# Patient Record
Sex: Male | Born: 1985 | Race: Black or African American | Hispanic: No | Marital: Single | Smoking: Current every day smoker
Health system: Southern US, Community
[De-identification: ages and names within clinical notes are randomized; demographics above are authoritative.]

## PROBLEM LIST (undated history)

## (undated) DIAGNOSIS — G904 Autonomic dysreflexia: Secondary | ICD-10-CM

## (undated) DIAGNOSIS — N390 Urinary tract infection, site not specified: Secondary | ICD-10-CM

## (undated) DIAGNOSIS — N200 Calculus of kidney: Secondary | ICD-10-CM

## (undated) DIAGNOSIS — N12 Tubulo-interstitial nephritis, not specified as acute or chronic: Secondary | ICD-10-CM

## (undated) DIAGNOSIS — Z89611 Acquired absence of right leg above knee: Secondary | ICD-10-CM

## (undated) DIAGNOSIS — N210 Calculus in bladder: Secondary | ICD-10-CM

## (undated) DIAGNOSIS — S24101A Unspecified injury at T1 level of thoracic spinal cord, initial encounter: Secondary | ICD-10-CM

## (undated) DIAGNOSIS — Z89612 Acquired absence of left leg above knee: Secondary | ICD-10-CM

## (undated) HISTORY — PX: ABOVE KNEE LEG AMPUTATION: SUR20

---

## 2004-04-25 DIAGNOSIS — S24111A Complete lesion at T1 level of thoracic spinal cord, initial encounter: Secondary | ICD-10-CM | POA: Insufficient documentation

## 2006-08-19 ENCOUNTER — Emergency Department (HOSPITAL_COMMUNITY): Admission: EM | Admit: 2006-08-19 | Discharge: 2006-08-19 | Payer: Self-pay | Admitting: Emergency Medicine

## 2006-10-16 ENCOUNTER — Inpatient Hospital Stay (HOSPITAL_COMMUNITY): Admission: EM | Admit: 2006-10-16 | Discharge: 2006-10-21 | Payer: Self-pay | Admitting: Emergency Medicine

## 2006-10-28 ENCOUNTER — Emergency Department (HOSPITAL_COMMUNITY): Admission: EM | Admit: 2006-10-28 | Discharge: 2006-10-28 | Payer: Self-pay | Admitting: Emergency Medicine

## 2006-12-18 ENCOUNTER — Emergency Department (HOSPITAL_COMMUNITY): Admission: EM | Admit: 2006-12-18 | Discharge: 2006-12-19 | Payer: Self-pay | Admitting: Emergency Medicine

## 2007-04-23 ENCOUNTER — Emergency Department (HOSPITAL_COMMUNITY): Admission: EM | Admit: 2007-04-23 | Discharge: 2007-04-24 | Payer: Self-pay | Admitting: Emergency Medicine

## 2007-07-10 ENCOUNTER — Inpatient Hospital Stay (HOSPITAL_COMMUNITY): Admission: EM | Admit: 2007-07-10 | Discharge: 2007-07-16 | Payer: Self-pay | Admitting: Emergency Medicine

## 2007-07-17 ENCOUNTER — Inpatient Hospital Stay (HOSPITAL_COMMUNITY): Admission: EM | Admit: 2007-07-17 | Discharge: 2007-07-29 | Payer: Self-pay | Admitting: Emergency Medicine

## 2008-04-07 ENCOUNTER — Emergency Department (HOSPITAL_COMMUNITY): Admission: EM | Admit: 2008-04-07 | Discharge: 2008-04-07 | Payer: Self-pay | Admitting: Emergency Medicine

## 2010-11-12 ENCOUNTER — Encounter: Payer: Self-pay | Admitting: Family Medicine

## 2010-11-12 ENCOUNTER — Encounter (HOSPITAL_BASED_OUTPATIENT_CLINIC_OR_DEPARTMENT_OTHER): Payer: Self-pay | Admitting: Internal Medicine
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
## 2011-03-06 NOTE — Discharge Summary (Signed)
[name], [name]                  ACCOUNT NO.:  [id number]   MEDICAL RECORD NO.:  [id number]          PATIENT TYPE:  INP   LOCATION:  5528                         FACILITY:  [facility]   PHYSICIAN:  Lonia Blood, M.D.       DATE OF BIRTH:  1986-03-22   DATE OF ADMISSION:  07/09/2007  DATE OF DISCHARGE:                               DISCHARGE SUMMARY   DISCHARGE DIAGNOSES:  1. Urinary tract infection - resolving.  2. Nausea and vomiting secondary to the urinary tract infection -      resolved.  3. History of methicillin-resistant Staphylococcus aureus.  4..  Stage II pressure ulcer on the right buttock.  1. Chronic pain syndrome.  2. Chronic indwelling Foley catheter.   DISCHARGE MEDICATIONS:  1. Neurontin 600 mg three times a day.  2. Methadone 10 mg three times a day.  3. Protonix 40 mg daily.  4. Valium 5 mg every hours as needed.  5. Ambien 5 mg by mouth at bedtime as needed.   PROCEDURE:  This admission no procedures were done.   CONSULTATIONS:  The patient was seen by the wound care specialist at  [facility] and who recommended an air mattress overlay, Geomat  cushion for the wheelchair and Mepilex borders to the right buttock.   DISPOSITION:  [name] will be discharged to an assisted living or  skilled nursing facility once bed will be available.   History and physical refer to dictated H&P done by Dr. Hannah Beat on  July 10, 2007.   HOSPITAL COURSE.:  #1 - URINARY TRACT INFECTION.  [name] presented  with elevated white blood cell count in the urinalysis  and with nausea,  vomiting and sick feeling.  He was started empirically on ciprofloxacin.  His urine culture grew multiple pathogens, none of them dominant.  His  symptoms improved with ciprofloxacin and our plan is to continue the  antibiotic for one week.   #2 - DECUBITUS.  Due to poor living conditions, [name] has developed a  right buttock decubitus.  He was seen by the wound care specialist,  who  recommended a Mepilex border and an air mattress.  Also, she recommended  [name] be placed in an assisted living.   #3 - CHRONIC PAIN SYNDROME.  We have resumed the patient's methadone and  his pain has been under better control.      Lonia Blood, M.D.  Electronically Signed     SL/MEDQ  D:  07/15/2007  T:  07/15/2007  Job:  [id number]
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
## 2011-03-06 NOTE — H&P (Signed)
NAME:  [name], [name]   MEDICAL RECORD NO.:  [id number]          PATIENT TYPE:  INP   LOCATION:  5121                         FACILITY:  [facility]   PHYSICIAN:  Wilson Singer, M.D.DATE OF BIRTH:  06-23-1986   DATE OF ADMISSION:  07/17/2007  DATE OF DISCHARGE:                              HISTORY & PHYSICAL   HISTORY:  This is a 25 year old man who was just discharged yesterday to  an assisted living facility, but apparently there was a verbal  disagreement with the staff there because he was not apparently able to  get his pain medications.  He expected to go to a skilled nursing  facility, but I think he went to an assisted living facility, and he  really expected 24-hour nursing care, which is probably what this man  does require.  He is paraplegic, and he has chronic pain syndrome with a  history of decubitus ulcers.  Recently, he was discharged having had a  urinary tract infection and underwent appropriate treatment for this.  Please see discharge summary done by Dr. Lonia Blood for all previous  details.   PHYSICAL EXAMINATION:  VITAL SIGNS:  Temperature 98.0, blood pressure  108/62, pulse 73, saturating 98%.  GENERAL:  He looks clinically well and is not septic or toxic.  CARDIOVASCULAR:  Heart sounds are present and normal.  LUNG:  Lung fields are clear.  ABDOMEN:  Soft and tender.  NEUROLOGICAL:  He is paraplegic.   INVESTIGATIONS:  Sodium 140, potassium 3.9, BUN 10, glucose 92,  creatinine 0.9.  Hemoglobin 12.5, white blood cell count 5.2, platelets  332.   IMPRESSION:  1. Paraplegia.  2. Chronic pain syndrome.  3. Decubitus ulcer.  4. Placement issues.   PLAN:  1. Admit.  2. Social work consult for placement regarding a skilled nursing      facility.  3. Wound care consult.   Further recommendations will depend on the patient's hospital progress.      Wilson Singer, M.D.  Electronically Signed    NCG/MEDQ  D:   07/17/2007  T:  07/18/2007  Job:  [id number]
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
## 2011-03-09 NOTE — Discharge Summary (Signed)
[name], [name]                  ACCOUNT NO.:  [id number]   MEDICAL RECORD NO.:  [id number]          PATIENT TYPE:  INP   LOCATION:  1507                         FACILITY:  [facility]   PHYSICIAN:  Lonia Blood, M.D.      DATE OF BIRTH:  Feb 27, 1986   DATE OF ADMISSION:  10/16/2006  DATE OF DISCHARGE:  10/21/2006                               DISCHARGE SUMMARY   ADDENDUM:  Please refer to discharge summary Dr. Hannah Beat.  Patient  was scheduled to have been discharged on October 20, 2006.  He was,  however, awaiting insertion of PICC line for IV antibiotics at home.  That was not placed until December 31 in the morning.  Having that in  place, patient was subsequently discharged.  There was no change from  the discharge summary of October 20, 2006, as dictated by Dr. Hannah Beat.      Lonia Blood, M.D.  Electronically Signed     LG/MEDQ  D:  11/06/2006  T:  11/06/2006  Job:  [id number]
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
## 2011-08-02 LAB — URINE MICROSCOPIC-ADD ON

## 2011-08-02 LAB — POCT I-STAT CREATININE: Operator id: 270111

## 2011-08-02 LAB — COMPREHENSIVE METABOLIC PANEL
ALT: 23
AST: 21
Albumin: 3.7
Alkaline Phosphatase: 205 — ABNORMAL HIGH
BUN: 11
CO2: 26
CO2: 32
Calcium: 9.2
Chloride: 105
GFR calc Af Amer: >60
GFR calc non Af Amer: >60
GFR calc non Af Amer: >60
Glucose, Bld: 112 — ABNORMAL HIGH
Potassium: 3.8
Sodium: 138
Total Bilirubin: 1
Total Protein: 7

## 2011-08-02 LAB — CBC
HCT: 34.9 — ABNORMAL LOW
HCT: 35.3 — ABNORMAL LOW
HCT: 37.9 — ABNORMAL LOW
Hemoglobin: 11.7 — ABNORMAL LOW
MCHC: 33
MCV: 89.3
MCV: 89.5
Platelets: 310
Platelets: 332
Platelets: 402 — ABNORMAL HIGH
RBC: 4.44
RDW: 15.1 — ABNORMAL HIGH
RDW: 15.5 — ABNORMAL HIGH
WBC: 4.6
WBC: 6.4

## 2011-08-02 LAB — BASIC METABOLIC PANEL
BUN: 7
Chloride: 107
Potassium: 3.8

## 2011-08-02 LAB — URINALYSIS, ROUTINE W REFLEX MICROSCOPIC
Glucose, UA: NEGATIVE
Glucose, UA: NEGATIVE
Hgb urine dipstick: NEGATIVE
Nitrite: NEGATIVE
Protein, ur: NEGATIVE
Specific Gravity, Urine: 1.029
Urobilinogen, UA: 2 — ABNORMAL HIGH
pH: 6.5

## 2011-08-02 LAB — URINE CULTURE

## 2011-08-02 LAB — DIFFERENTIAL
Basophils Absolute: 0
Basophils Absolute: 0
Basophils Relative: 0
Eosinophils Absolute: 0.1
Eosinophils Absolute: 0.2
Eosinophils Relative: 3
Eosinophils Relative: 3
Lymphs Abs: 1.7
Monocytes Absolute: 0.6

## 2011-08-02 LAB — LIPASE, BLOOD: Lipase: 12

## 2011-08-02 LAB — I-STAT 8, (EC8 V) (CONVERTED LAB)
Bicarbonate: 27.7 — ABNORMAL HIGH
Glucose, Bld: 92
TCO2: 29
pH, Ven: 7.361 — ABNORMAL HIGH

## 2011-08-02 LAB — CULTURE, BLOOD (ROUTINE X 2): Culture: NO GROWTH

## 2011-08-02 LAB — AMYLASE: Amylase: 98

## 2013-02-05 DIAGNOSIS — D649 Anemia, unspecified: Secondary | ICD-10-CM | POA: Insufficient documentation

## 2013-03-18 DIAGNOSIS — Z87898 Personal history of other specified conditions: Secondary | ICD-10-CM | POA: Insufficient documentation

## 2013-03-19 DIAGNOSIS — N200 Calculus of kidney: Secondary | ICD-10-CM | POA: Insufficient documentation

## 2013-05-09 DIAGNOSIS — M549 Dorsalgia, unspecified: Secondary | ICD-10-CM | POA: Insufficient documentation

## 2013-10-30 DIAGNOSIS — Z89619 Acquired absence of unspecified leg above knee: Secondary | ICD-10-CM | POA: Insufficient documentation

## 2014-03-05 DIAGNOSIS — G8921 Chronic pain due to trauma: Secondary | ICD-10-CM | POA: Insufficient documentation

## 2016-03-10 DIAGNOSIS — G904 Autonomic dysreflexia: Secondary | ICD-10-CM | POA: Insufficient documentation

## 2016-03-10 DIAGNOSIS — N12 Tubulo-interstitial nephritis, not specified as acute or chronic: Secondary | ICD-10-CM | POA: Insufficient documentation

## 2016-09-04 DIAGNOSIS — G44209 Tension-type headache, unspecified, not intractable: Secondary | ICD-10-CM | POA: Insufficient documentation

## 2016-09-04 DIAGNOSIS — N390 Urinary tract infection, site not specified: Secondary | ICD-10-CM | POA: Insufficient documentation

## 2016-09-20 DIAGNOSIS — R45851 Suicidal ideations: Secondary | ICD-10-CM | POA: Insufficient documentation

## 2016-09-20 DIAGNOSIS — F32A Depression, unspecified: Secondary | ICD-10-CM | POA: Insufficient documentation

## 2017-10-10 DIAGNOSIS — R350 Frequency of micturition: Secondary | ICD-10-CM | POA: Insufficient documentation

## 2017-10-10 DIAGNOSIS — N3001 Acute cystitis with hematuria: Secondary | ICD-10-CM | POA: Insufficient documentation

## 2017-10-10 DIAGNOSIS — R110 Nausea: Secondary | ICD-10-CM | POA: Insufficient documentation

## 2017-10-31 DIAGNOSIS — M898X9 Other specified disorders of bone, unspecified site: Secondary | ICD-10-CM | POA: Insufficient documentation

## 2017-12-30 DIAGNOSIS — E669 Obesity, unspecified: Secondary | ICD-10-CM | POA: Insufficient documentation

## 2018-01-15 DIAGNOSIS — Z8614 Personal history of Methicillin resistant Staphylococcus aureus infection: Secondary | ICD-10-CM | POA: Insufficient documentation

## 2018-01-21 DIAGNOSIS — K5900 Constipation, unspecified: Secondary | ICD-10-CM | POA: Insufficient documentation

## 2020-12-15 ENCOUNTER — Emergency Department: Payer: Medicaid Other

## 2020-12-15 ENCOUNTER — Other Ambulatory Visit: Payer: Self-pay

## 2020-12-15 ENCOUNTER — Emergency Department
Admission: EM | Admit: 2020-12-15 | Discharge: 2020-12-15 | Disposition: A | Discharge status: Home / Self Care | Payer: Medicaid Other | Attending: Emergency Medicine | Admitting: Emergency Medicine

## 2020-12-15 DIAGNOSIS — K592 Neurogenic bowel, not elsewhere classified: Secondary | ICD-10-CM | POA: Insufficient documentation

## 2020-12-15 DIAGNOSIS — R319 Hematuria, unspecified: Secondary | ICD-10-CM | POA: Diagnosis present

## 2020-12-15 DIAGNOSIS — N529 Male erectile dysfunction, unspecified: Secondary | ICD-10-CM | POA: Insufficient documentation

## 2020-12-15 DIAGNOSIS — N390 Urinary tract infection, site not specified: Secondary | ICD-10-CM | POA: Diagnosis not present

## 2020-12-15 DIAGNOSIS — G8220 Paraplegia, unspecified: Secondary | ICD-10-CM | POA: Insufficient documentation

## 2020-12-15 DIAGNOSIS — M8640 Chronic osteomyelitis with draining sinus, unspecified site: Secondary | ICD-10-CM | POA: Insufficient documentation

## 2020-12-15 DIAGNOSIS — Z91040 Latex allergy status: Secondary | ICD-10-CM | POA: Diagnosis not present

## 2020-12-15 DIAGNOSIS — Z89619 Acquired absence of unspecified leg above knee: Secondary | ICD-10-CM | POA: Diagnosis not present

## 2020-12-15 DIAGNOSIS — G8929 Other chronic pain: Secondary | ICD-10-CM | POA: Insufficient documentation

## 2020-12-15 LAB — LACTIC ACID, PLASMA: Lactic Acid, Venous: 1.5 mmol/L (ref 0.5–1.9)

## 2020-12-15 LAB — COMPREHENSIVE METABOLIC PANEL
ALT: 30 U/L (ref 0–44)
AST: 26 U/L (ref 15–41)
Albumin: 3.9 g/dL (ref 3.5–5.0)
Alkaline Phosphatase: 113 U/L (ref 38–126)
Anion gap: 7 (ref 5–15)
BUN: 18 mg/dL (ref 6–20)
CO2: 23 mmol/L (ref 22–32)
Calcium: 9 mg/dL (ref 8.9–10.3)
Chloride: 107 mmol/L (ref 98–111)
Creatinine, Ser: 0.67 mg/dL (ref 0.61–1.24)
GFR, Estimated: >60 mL/min (ref >60)
Glucose, Bld: 95 mg/dL (ref 70–99)
Potassium: 3.9 mmol/L (ref 3.5–5.1)
Sodium: 137 mmol/L (ref 135–145)
Total Bilirubin: 0.7 mg/dL (ref 0.3–1.2)
Total Protein: 8 g/dL (ref 6.5–8.1)

## 2020-12-15 LAB — URINALYSIS, COMPLETE (UACMP) WITH MICROSCOPIC
Bilirubin Urine: NEGATIVE
Glucose, UA: NEGATIVE mg/dL
Ketones, ur: NEGATIVE mg/dL
Nitrite: POSITIVE — AB
Protein, ur: 100 mg/dL — AB
RBC / HPF: >50 RBC/hpf — ABNORMAL HIGH (ref 0–5)
Specific Gravity, Urine: 1.021 (ref 1.005–1.030)
WBC, UA: >50 WBC/hpf — ABNORMAL HIGH (ref 0–5)
pH: 6 (ref 5.0–8.0)

## 2020-12-15 LAB — CBC WITH DIFFERENTIAL/PLATELET
Abs Immature Granulocytes: 0.01 10*3/uL (ref 0.00–0.07)
Basophils Absolute: 0 10*3/uL (ref 0.0–0.1)
Basophils Relative: 1 %
Eosinophils Absolute: 0.2 10*3/uL (ref 0.0–0.5)
Eosinophils Relative: 2 %
HCT: 42.5 % (ref 39.0–52.0)
Hemoglobin: 13.6 g/dL (ref 13.0–17.0)
Immature Granulocytes: 0 %
Lymphocytes Relative: 30 %
Lymphs Abs: 2 10*3/uL (ref 0.7–4.0)
MCH: 30.2 pg (ref 26.0–34.0)
MCHC: 32 g/dL (ref 30.0–36.0)
MCV: 94.2 fL (ref 80.0–100.0)
Monocytes Absolute: 0.6 10*3/uL (ref 0.1–1.0)
Monocytes Relative: 9 %
Neutro Abs: 3.9 10*3/uL (ref 1.7–7.7)
Neutrophils Relative %: 58 %
Platelets: 234 10*3/uL (ref 150–400)
RBC: 4.51 MIL/uL (ref 4.22–5.81)
RDW: 13.5 % (ref 11.5–15.5)
WBC: 6.6 10*3/uL (ref 4.0–10.5)
nRBC: 0 % (ref 0.0–0.2)

## 2020-12-15 MED ORDER — NITROFURANTOIN MONOHYD MACRO 100 MG PO CAPS: 100.0000 mg | ORAL_CAPSULE | Freq: Two times a day (BID) | ORAL | 0 refills | Status: AC

## 2020-12-15 MED ORDER — NITROFURANTOIN MONOHYD MACRO 100 MG PO CAPS
100.0000 mg | ORAL_CAPSULE | Freq: Once | ORAL | Status: AC
  Administered 2020-12-15: 100 mg via ORAL
  Filled 2020-12-15: qty 1

## 2020-12-15 NOTE — ED Provider Notes (Signed)
[facility] Emergency Department Provider Note   ____________________________________________    I have reviewed the triage vital signs and the nursing notes.   HISTORY  Chief Complaint Foul-smelling urine, hematuria    HPI [name] is a 35 y.o. male who presents with concerns of urinary tract infection.  Patient reports he self caths and recently has noted hematuria and foul-smelling urine over the last several days.  He is concerned that the infection may have spread to the rest of his body as he had chills last night.  He is feeling improved today.  No nausea or vomiting.  No past medical history on file.  Patient Active Problem List   Diagnosis Date Noted  . Chronic osteomyelitis with draining sinus (HCC) 12/15/2020  . Chronic pain 12/15/2020  . ED (erectile dysfunction) 12/15/2020  . Neurogenic bowel 12/15/2020  . Paraplegia (HCC) 12/15/2020  . Constipation 01/21/2018  . History of MRSA infection 01/15/2018  . Obesity (BMI 30-39.9) 12/30/2017  . Heterotopic ossification of bone 10/31/2017  . Acute cystitis with hematuria 10/10/2017  . Chronic nausea 10/10/2017  . Frequent urination 10/10/2017  . Depression 09/20/2016  . Suicidal ideation 09/20/2016  . Acute UTI 09/04/2016  . Headache, tension-type 09/04/2016  . 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 Pyelonephritis 03/10/2016  . Pain, chronic due to trauma 03/05/2014  . S/P AKA (above knee amputation) unilateral (HCC) 10/30/2013  . Back pain 05/09/2013  . Nephrolithiasis 03/19/2013  . History of syncope 03/18/2013  . Chronic anemia 02/05/2013  . 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 Authorizing Provider  nitrofurantoin, macrocrystal-monohydrate, (MACROBID) 100 MG capsule Take 1 capsule (100 mg total) by mouth 2 (two) times daily for 10 days. 12/15/20 12/25/20 Yes Jene Every, MD     Allergies Ketorolac, Oxycodone, Ciprofloxacin, Coconut oil, Tobramycin, Cefdinir, Latex, Other, and Piperacillin-tazobactam in dex  No family history on file.  Social History    Review of Systems  Constitutional: As above  Cardiovascular: Denies chest pain. Respiratory: Denies shortness of breath. Gastrointestinal:  no vomiting.   Genitourinary: As above  Skin: Negative for rash.    ____________________________________________   PHYSICAL EXAM:  VITAL SIGNS: ED Triage Vitals  Enc Vitals Group     BP 12/15/20 0937 (!) 146/98     Pulse Rate 12/15/20 0937 72     Resp 12/15/20 0937 18     Temp 12/15/20 0937 98.1 F (36.7 C)     Temp Source 12/15/20 0937 Oral     SpO2 12/15/20 0937 100 %     Weight 12/15/20 0939 85.7 kg (189 lb)     Height --      Head Circumference --      Peak Flow --      Pain Score 12/15/20 0933 0     Pain Loc --      Pain Edu? --      Excl. in GC? --     Constitutional: Alert and oriented. No acute distress. Pleasant and interactive  Nose: No congestion/rhinnorhea. Mouth/Throat: Mucous membranes are moist.    Cardiovascular: Normal rate, regular rhythm. Grossly normal heart sounds.  Good peripheral circulation. Respiratory: Normal respiratory effort.  No retractions. Gastrointestinal: Soft and nontender.   Musculoskeletal: Bilateral AKA's Neurologic:  Normal speech and language. No gross focal neurologic deficits are appreciated.  Skin:  Skin is warm, dry and  intact. No rash noted. Psychiatric: Mood and affect are normal. Speech and behavior are normal.  ____________________________________________   LABS (all labs ordered are listed, but only abnormal results are displayed)  Labs Reviewed  URINALYSIS, COMPLETE (UACMP) WITH MICROSCOPIC - Abnormal; Notable for the following components:      Result Value   Color, Urine YELLOW (*)    APPearance CLOUDY (*)    Hgb urine dipstick LARGE (*)    Protein, ur 100 (*)     Nitrite POSITIVE (*)    Leukocytes,Ua MODERATE (*)    RBC / HPF >50 (*)    WBC, UA >50 (*)    Bacteria, UA FEW (*)    All other components within normal limits  LACTIC ACID, PLASMA  COMPREHENSIVE METABOLIC PANEL  CBC WITH DIFFERENTIAL/PLATELET  LACTIC ACID, PLASMA   ____________________________________________  EKG  None ____________________________________________  RADIOLOGY  Chest x-ray unremarkable ____________________________________________   PROCEDURES  Procedure(s) performed: No  Procedures   Critical Care performed: No ____________________________________________   INITIAL IMPRESSION / ASSESSMENT AND PLAN / ED COURSE  Pertinent labs & imaging results that were available during my care of the patient were reviewed by me and considered in my medical decision making (see chart for details).  Patient presents with hematuria, foul-smelling urine, patient does self cath.  Urinalysis is consistent with urinary tract infection, white blood cell count is normal, lactic is normal  Initially plan was to give IM Rocephin however the patient has an allergy to cefdinir, will give Macrobid x10 days, strict return precautions discussed, patient agrees with this plan.    ____________________________________________   FINAL CLINICAL IMPRESSION(S) / ED DIAGNOSES  Final diagnoses:  Lower urinary tract infectious disease        Note:  This document was prepared using Dragon voice recognition software and may include unintentional dictation errors.   Jene Every, MD 12/15/20 1501

## 2020-12-15 NOTE — ED Triage Notes (Signed)
First Nurse Note:  C/O fever and chills.  Also c/o hematuria and rectal bleeding today.  Patient states he is concerned that his UTI has gone to his blood.

## 2020-12-15 NOTE — ED Notes (Signed)
D/C and new RX discussed with pt, pt verbalized understanding. NAD noted. VSS.  

## 2020-12-15 NOTE — ED Notes (Signed)
This RN agrees with triage assessment.   To add: Pt states he has been able to feel a pulling in his groin which he normally cant feel. Pt is A&OX4. NAD noted.

## 2021-03-12 ENCOUNTER — Emergency Department: Payer: Medicaid Other

## 2021-03-12 ENCOUNTER — Other Ambulatory Visit: Payer: Self-pay

## 2021-03-12 ENCOUNTER — Encounter: Payer: Self-pay | Admitting: Radiology

## 2021-03-12 ENCOUNTER — Emergency Department
Admission: EM | Admit: 2021-03-12 | Discharge: 2021-03-12 | Disposition: A | Discharge status: Home / Self Care | Payer: Medicaid Other | Attending: Emergency Medicine | Admitting: Emergency Medicine

## 2021-03-12 DIAGNOSIS — S0990XA Unspecified injury of head, initial encounter: Secondary | ICD-10-CM | POA: Insufficient documentation

## 2021-03-12 DIAGNOSIS — Z91040 Latex allergy status: Secondary | ICD-10-CM | POA: Insufficient documentation

## 2021-03-12 DIAGNOSIS — S32039A Unspecified fracture of third lumbar vertebra, initial encounter for closed fracture: Secondary | ICD-10-CM | POA: Diagnosis not present

## 2021-03-12 DIAGNOSIS — W19XXXA Unspecified fall, initial encounter: Secondary | ICD-10-CM

## 2021-03-12 DIAGNOSIS — Z20822 Contact with and (suspected) exposure to covid-19: Secondary | ICD-10-CM | POA: Diagnosis not present

## 2021-03-12 DIAGNOSIS — S3992XA Unspecified injury of lower back, initial encounter: Secondary | ICD-10-CM | POA: Diagnosis present

## 2021-03-12 DIAGNOSIS — R310 Gross hematuria: Secondary | ICD-10-CM | POA: Diagnosis not present

## 2021-03-12 DIAGNOSIS — R1032 Left lower quadrant pain: Secondary | ICD-10-CM | POA: Diagnosis not present

## 2021-03-12 DIAGNOSIS — S32009A Unspecified fracture of unspecified lumbar vertebra, initial encounter for closed fracture: Secondary | ICD-10-CM

## 2021-03-12 DIAGNOSIS — W1812XA Fall from or off toilet with subsequent striking against object, initial encounter: Secondary | ICD-10-CM | POA: Insufficient documentation

## 2021-03-12 LAB — RESP PANEL BY RT-PCR (FLU A&B, COVID) ARPGX2
Influenza A by PCR: NEGATIVE
Influenza B by PCR: NEGATIVE
SARS Coronavirus 2 by RT PCR: NEGATIVE

## 2021-03-12 LAB — CBC WITH DIFFERENTIAL/PLATELET
Abs Immature Granulocytes: 0.01 10*3/uL (ref 0.00–0.07)
Basophils Absolute: 0 10*3/uL (ref 0.0–0.1)
Basophils Relative: 1 %
Eosinophils Absolute: 0.2 10*3/uL (ref 0.0–0.5)
Eosinophils Relative: 4 %
HCT: 36.8 % — ABNORMAL LOW (ref 39.0–52.0)
Hemoglobin: 12.1 g/dL — ABNORMAL LOW (ref 13.0–17.0)
Immature Granulocytes: 0 %
Lymphocytes Relative: 36 %
Lymphs Abs: 1.6 10*3/uL (ref 0.7–4.0)
MCH: 30.9 pg (ref 26.0–34.0)
MCHC: 32.9 g/dL (ref 30.0–36.0)
MCV: 93.9 fL (ref 80.0–100.0)
Monocytes Absolute: 0.6 10*3/uL (ref 0.1–1.0)
Monocytes Relative: 14 %
Neutro Abs: 2 10*3/uL (ref 1.7–7.7)
Neutrophils Relative %: 45 %
Platelets: 209 10*3/uL (ref 150–400)
RBC: 3.92 MIL/uL — ABNORMAL LOW (ref 4.22–5.81)
RDW: 13.2 % (ref 11.5–15.5)
WBC: 4.4 10*3/uL (ref 4.0–10.5)
nRBC: 0 % (ref 0.0–0.2)

## 2021-03-12 LAB — URINALYSIS, COMPLETE (UACMP) WITH MICROSCOPIC
Bacteria, UA: NONE SEEN
Bilirubin Urine: NEGATIVE
Glucose, UA: NEGATIVE mg/dL
Ketones, ur: NEGATIVE mg/dL
Nitrite: NEGATIVE
Protein, ur: 100 mg/dL — AB
RBC / HPF: >50 RBC/hpf — ABNORMAL HIGH (ref 0–5)
Specific Gravity, Urine: 1.024 (ref 1.005–1.030)
WBC, UA: >50 WBC/hpf — ABNORMAL HIGH (ref 0–5)
pH: 6 (ref 5.0–8.0)

## 2021-03-12 LAB — COMPREHENSIVE METABOLIC PANEL
ALT: 23 U/L (ref 0–44)
AST: 23 U/L (ref 15–41)
Albumin: 3.8 g/dL (ref 3.5–5.0)
Alkaline Phosphatase: 103 U/L (ref 38–126)
Anion gap: 6 (ref 5–15)
BUN: 26 mg/dL — ABNORMAL HIGH (ref 6–20)
CO2: 24 mmol/L (ref 22–32)
Calcium: 8.7 mg/dL — ABNORMAL LOW (ref 8.9–10.3)
Chloride: 110 mmol/L (ref 98–111)
Creatinine, Ser: 0.85 mg/dL (ref 0.61–1.24)
GFR, Estimated: >60 mL/min (ref >60)
Glucose, Bld: 107 mg/dL — ABNORMAL HIGH (ref 70–99)
Potassium: 3.8 mmol/L (ref 3.5–5.1)
Sodium: 140 mmol/L (ref 135–145)
Total Bilirubin: 0.7 mg/dL (ref 0.3–1.2)
Total Protein: 7.5 g/dL (ref 6.5–8.1)

## 2021-03-12 MED ORDER — TRAMADOL HCL 50 MG PO TABS: 50.0000 mg | ORAL_TABLET | Freq: Four times a day (QID) | ORAL | 0 refills | Status: DC | PRN

## 2021-03-12 MED ORDER — LIDOCAINE 5 % EX PTCH
1.0000 | MEDICATED_PATCH | CUTANEOUS | Status: DC
  Administered 2021-03-12: 1 via TRANSDERMAL
  Filled 2021-03-12: qty 1

## 2021-03-12 MED ORDER — ONDANSETRON HCL 4 MG/2ML IJ SOLN
4.0000 mg | Freq: Once | INTRAMUSCULAR | Status: AC
  Administered 2021-03-12: 4 mg via INTRAVENOUS
  Filled 2021-03-12: qty 2

## 2021-03-12 MED ORDER — FENTANYL CITRATE (PF) 100 MCG/2ML IJ SOLN
50.0000 ug | Freq: Once | INTRAMUSCULAR | Status: AC
Start: 2021-03-12 — End: 2021-03-12
  Administered 2021-03-12: 50 ug via INTRAVENOUS
  Filled 2021-03-12: qty 2

## 2021-03-12 MED ORDER — TRAMADOL HCL 50 MG PO TABS
50.0000 mg | ORAL_TABLET | Freq: Once | ORAL | Status: AC
  Administered 2021-03-12: 50 mg via ORAL
  Filled 2021-03-12: qty 1

## 2021-03-12 MED ORDER — LACTATED RINGERS IV BOLUS
1000.0000 mL | Freq: Once | INTRAVENOUS | Status: AC
  Administered 2021-03-12: 1000 mL via INTRAVENOUS

## 2021-03-12 MED ORDER — IOHEXOL 300 MG/ML  SOLN
100.0000 mL | Freq: Once | INTRAMUSCULAR | Status: AC | PRN
  Administered 2021-03-12: 100 mL via INTRAVENOUS

## 2021-03-12 MED ORDER — ACETAMINOPHEN 500 MG PO TABS: 1000.0000 mg | ORAL_TABLET | Freq: Three times a day (TID) | ORAL | 0 refills | Status: AC | PRN

## 2021-03-12 MED ORDER — ACETAMINOPHEN 500 MG PO TABS
1000.0000 mg | ORAL_TABLET | Freq: Once | ORAL | Status: AC
  Administered 2021-03-12: 1000 mg via ORAL
  Filled 2021-03-12: qty 2

## 2021-03-12 NOTE — ED Provider Notes (Signed)
[facility] Emergency Department Provider Note  ____________________________________________  Time seen: Approximately 5:36 AM  I have reviewed the triage vital signs and the nursing notes.   HISTORY  Chief Complaint Fall and Hematuria   HPI [name] is a 35 y.o. male with several chronic comorbidities as listed below including paraplegia, self-catheterization, neurogenic bladder who presents for evaluation of fall hematuria.  Patient reports having 2 falls yesterday.  The first 1 was while transferring from his truck to his wheelchair.  Later in the day he fell off the wheelchair.  He noticed hematuria when he cath himself after these falls.  This evening, he cath himself and had blood again.  He then felt the urge to go to the bathroom to have a bowel movement.  While on the toilet patient had a large episode of diarrhea, felt dizzy and passed out hitting his head on the floor.  He is complaining of a throbbing headache and pain on his left abdomen/lower back from the fall.  He is not on blood thinners.  He denies dysuria, fever, chills, nausea, vomiting.   History reviewed. No pertinent past medical history.  Patient Active Problem List   Diagnosis Date Noted  . Chronic osteomyelitis with draining sinus (HCC) 12/15/2020  . Chronic pain 12/15/2020  . ED (erectile dysfunction) 12/15/2020  . Neurogenic bowel 12/15/2020  . Paraplegia (HCC) 12/15/2020  . Constipation 01/21/2018  . History of MRSA infection 01/15/2018  . Obesity (BMI 30-39.9) 12/30/2017  . Heterotopic ossification of bone 10/31/2017  . Acute cystitis with hematuria 10/10/2017  . Chronic nausea 10/10/2017  . Frequent urination 10/10/2017  . Depression 09/20/2016  . Suicidal ideation 09/20/2016  . Acute UTI 09/04/2016  . Headache, tension-type 09/04/2016  . 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 Pyelonephritis 03/10/2016  . Pain, chronic due to trauma 03/05/2014  . S/P AKA (above knee  amputation) unilateral (HCC) 10/30/2013  . Back pain 05/09/2013  . Nephrolithiasis 03/19/2013  . History of syncope 03/18/2013  . Chronic anemia 02/05/2013  . 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  Social History    Review of Systems  Constitutional: Negative for fever. + dizziness and syncope Eyes: Negative for visual changes. ENT: Negative for facial injury or neck injury Cardiovascular: Negative for chest injury. Respiratory: Negative for shortness of breath. Negative for chest wall injury. Gastrointestinal: + left sided abdominal pain  Genitourinary: Negative for dysuria. + hematuria Musculoskeletal: + L sided back pain Skin: Negative for laceration/abrasions. Neurological: + head injury.   ____________________________________________   PHYSICAL EXAM:  VITAL SIGNS: ED Triage Vitals  Enc Vitals Group     BP 03/12/21 0412 122/79     Pulse Rate 03/12/21 0412 66     Resp 03/12/21 0412 19     Temp 03/12/21 0412 97.7 F (36.5 C)     Temp Source 03/12/21 0412 Oral     SpO2 03/12/21 0412 97 %     Weight 03/12/21 0413 180 lb (81.6 kg)     Height --      Head Circumference --      Peak Flow --      Pain Score 03/12/21 0413 10     Pain Loc --      Pain Edu? --      Excl. in GC? --  Full spinal precautions maintained throughout the trauma exam. Constitutional: Alert and oriented. No acute distress. Does not appear intoxicated. HEENT Head: Normocephalic and atraumatic. Face: No facial bony tenderness. Stable midface Ears: No hemotympanum bilaterally. No Battle sign Eyes: No eye injury. PERRL. No raccoon eyes Nose: Nontender. No epistaxis. No rhinorrhea Mouth/Throat: Mucous membranes are moist. No oropharyngeal blood. No dental injury. Airway patent without stridor. Normal  voice. Neck: no C-collar. No midline c-spine tenderness.  Cardiovascular: Normal rate, regular rhythm. Normal and symmetric distal pulses are present in all extremities. Pulmonary/Chest: Chest wall is stable and nontender to palpation/compression. Normal respiratory effort. Breath sounds are normal. No crepitus.  Abdominal: Soft, tender to palpation over the left flank and left abdominal wall with no bruising, non distended. Musculoskeletal: Nontender with normal full range of motion in all extremities. No deformities. No thoracic or lumbar midline spinal tenderness. Pelvis is stable. Skin: Skin is warm, dry and intact. No abrasions or contutions. Psychiatric: Speech and behavior are appropriate. Neurological: Normal speech and language. Face is symmetric. Moves upper extremities to command. No gross focal neurologic deficits are appreciated.  Glascow Coma Score: 4 - Opens eyes on own 6 - Follows simple motor commands 5 - Alert and oriented GCS: 15   ____________________________________________   LABS (all labs ordered are listed, but only abnormal results are displayed)  Labs Reviewed  CBC WITH DIFFERENTIAL/PLATELET - Abnormal; Notable for the following components:      Result Value   RBC 3.92 (*)    Hemoglobin 12.1 (*)    HCT 36.8 (*)    All other components within normal limits  COMPREHENSIVE METABOLIC PANEL - Abnormal; Notable for the following components:   Glucose, Bld 107 (*)    BUN 26 (*)    Calcium 8.7 (*)    All other components within normal limits  URINALYSIS, COMPLETE (UACMP) WITH MICROSCOPIC - Abnormal; Notable for the following components:   Color, Urine YELLOW (*)    APPearance HAZY (*)    Hgb urine dipstick LARGE (*)    Protein, ur 100 (*)    Leukocytes,Ua MODERATE (*)    RBC / HPF >50 (*)    WBC, UA >50 (*)    All other components within normal limits  RESP PANEL BY RT-PCR (FLU A&B, COVID) [id number]  URINE CULTURE    ____________________________________________  EKG  ED ECG REPORT I, Nita Sicklearolina Renne Cornick, the attending physician, personally viewed and interpreted this ECG.  Sinus rhythm with a rate of 69, normal intervals, normal axis, T wave inversions in inferior leads, no ST elevations or depressions ____________________________________________  RADIOLOGY  I have personally reviewed the images performed during this visit and I agree with the Radiologist's read.   Interpretation by Radiologist:  DG Chest 2 View  Result Date: 03/12/2021 CLINICAL DATA:  35 year old male status post falls left side chest pain. Transferring from truck to wheelchair. EXAM: CHEST - 2 VIEW COMPARISON:  Chest radiographs 12/15/2020 and earlier. FINDINGS: Seated AP and lateral views of the chest. Lung volumes and mediastinal contours remain normal. Visualized tracheal air column is within normal limits. No pneumothorax or pleural effusion. No confluent pulmonary opacity. No left rib fracture identified. Other visible osseous structures appear intact. Chronic surgical clips at the right axilla. Negative visible bowel gas pattern. IMPRESSION: No acute cardiopulmonary abnormality or acute traumatic injury identified. Electronically Signed   By: Odessa FlemingH  Hall M.D.   On: 03/12/2021 05:23   CT Head Wo Contrast  Result Date: 03/12/2021 CLINICAL DATA:  he  fell while transferring from his truck to his wheelchair. Then while going into his house the wheelchair caught on lip of threshold and the chair tipped back and his fell again EXAM: CT HEAD WITHOUT CONTRAST CT CERVICAL SPINE WITHOUT CONTRAST TECHNIQUE: Multidetector CT imaging of the head and cervical spine was performed following the standard protocol without intravenous contrast. Multiplanar CT image reconstructions of the cervical spine were also generated. COMPARISON:  CT head 12/05/2010 report without imaging FINDINGS: CT HEAD FINDINGS Brain: No evidence of large-territorial acute  infarction. No parenchymal hemorrhage. No mass lesion. No extra-axial collection. No mass effect or midline shift. No hydrocephalus. Basilar cisterns are patent. Vascular: No hyperdense vessel. Skull: No acute fracture or focal lesion. Sinuses/Orbits: Paranasal sinuses and mastoid air cells are clear. The orbits are unremarkable. Other: None. CT CERVICAL SPINE FINDINGS Alignment: Normal. Skull base and vertebrae: No acute fracture. No aggressive appearing focal osseous lesion or focal pathologic process. Soft tissues and spinal canal: No prevertebral fluid or swelling. No visible canal hematoma. Upper chest: Unremarkable. Other: None. IMPRESSION: 1. No acute intracranial abnormality. 2. No acute displaced fracture or traumatic listhesis of the cervical spine. Electronically Signed   By: Tish Frederickson M.D.   On: 03/12/2021 06:47   CT Cervical Spine Wo Contrast  Result Date: 03/12/2021 CLINICAL DATA:  he fell while transferring from his truck to his wheelchair. Then while going into his house the wheelchair caught on lip of threshold and the chair tipped back and his fell again EXAM: CT HEAD WITHOUT CONTRAST CT CERVICAL SPINE WITHOUT CONTRAST TECHNIQUE: Multidetector CT imaging of the head and cervical spine was performed following the standard protocol without intravenous contrast. Multiplanar CT image reconstructions of the cervical spine were also generated. COMPARISON:  CT head 12/05/2010 report without imaging FINDINGS: CT HEAD FINDINGS Brain: No evidence of large-territorial acute infarction. No parenchymal hemorrhage. No mass lesion. No extra-axial collection. No mass effect or midline shift. No hydrocephalus. Basilar cisterns are patent. Vascular: No hyperdense vessel. Skull: No acute fracture or focal lesion. Sinuses/Orbits: Paranasal sinuses and mastoid air cells are clear. The orbits are unremarkable. Other: None. CT CERVICAL SPINE FINDINGS Alignment: Normal. Skull base and vertebrae: No acute  fracture. No aggressive appearing focal osseous lesion or focal pathologic process. Soft tissues and spinal canal: No prevertebral fluid or swelling. No visible canal hematoma. Upper chest: Unremarkable. Other: None. IMPRESSION: 1. No acute intracranial abnormality. 2. No acute displaced fracture or traumatic listhesis of the cervical spine. Electronically Signed   By: Tish Frederickson M.D.   On: 03/12/2021 06:47   CT ABDOMEN PELVIS W CONTRAST  Result Date: 03/12/2021 CLINICAL DATA:  35 year old male status post falls with left side chest pain after transferring from truck to wheelchair. EXAM: CT ABDOMEN AND PELVIS WITH CONTRAST TECHNIQUE: Multidetector CT imaging of the abdomen and pelvis was performed using the standard protocol following bolus administration of intravenous contrast. CONTRAST:  OMNIPAQUE IOHEXOL 300 MG/ML  SOLN COMPARISON:  Chest radiographs today and earlier. FINDINGS: Lower chest: No lung base pneumothorax or pleural effusion. Mild dependent atelectasis. No cardiomegaly or pericardial effusion. Hepatobiliary: Negative liver.  Contracted and negative gallbladder. Pancreas: Negative. Spleen: Spleen appears diminutive and intact. Adrenals/Urinary Tract: Normal adrenal glands. Occasional bilateral renal cortical scarring. Left nephrolithiasis with 5 mm lower pole stone(s). Occasional small benign renal cysts. No hydronephrosis or perinephric stranding. Negative ureters. Large oval 2.9 cm bladder stone. Decompressed bladder with circumferential wall thickening. Stomach/Bowel: Moderate retained stool in the sigmoid relatively sparing the rectum.  Negative descending and upstream large bowel segments with normal appendix on series 2, image 67. Terminal ileum is within normal limits. No dilated small bowel. Unremarkable stomach and duodenum. No free air, free fluid. Vascular/Lymphatic: IVC filter in place. Mild Aortoiliac calcified atherosclerosis. Surgically occluded distal left external iliac  artery. Other major arterial structures appear to remain patent. Portal venous system appears patent. Reproductive: Negative. Other: No pelvic free fluid. Musculoskeletal: No left lower rib fracture. But there is a mildly displaced fracture of the left L3 spinous process. However, this has a subacute appearance (series 2, image 45). Probable healed fracture of the left L2 and L4 spinous processes. Lumbar vertebrae otherwise appear intact. Disarticulated left hip and amputated left leg. Bulky dystrophic and heterotopic ossification about the right hip and proximal femur with ORIF hardware. No acute pelvic fracture identified. IMPRESSION: 1. Mildly displaced fracture of the left L3 spinous process, although probably subacute or chronic - with associated healed fractures of the left L2 and L4 spinous processes. 2. No other acute traumatic injury identified in the abdomen or pelvis. No lower rib fracture identified. 3. Large 2.9 cm bladder stone. Bladder wall thickening compatible with acute or chronic cystitis. Left nephrolithiasis. No obstructive uropathy. 4. Amputated left leg. Postoperative changes to the right hip with bulky dystrophic and heterotopic ossification. Electronically Signed   By: Odessa Fleming M.D.   On: 03/12/2021 06:52     ____________________________________________   PROCEDURES  Procedure(s) performed:yes .1-3 Lead EKG Interpretation Performed by: Nita Sickle, MD Authorized by: Nita Sickle, MD     Interpretation: non-specific     ECG rate assessment: normal     Rhythm: sinus rhythm     Ectopy: none     Conduction: normal     Critical Care performed:  None ____________________________________________   INITIAL IMPRESSION / ASSESSMENT AND PLAN / ED COURSE  35 y.o. male with several chronic comorbidities as listed below including paraplegia, self-catheterization, neurogenic bladder who presents for evaluation of fall x 3, dizziness, syncope, hematuria.  Patient with  hematuria that started after 2 falls.  Also complaining of left flank/lower back and left abdominal pain from the fall.  Ddx UTI vs pyelonephritis versus kidney stone versus renal injury from the fall. CT w/ IV contrast ordered. Ua and labs pending.  Syncope while having a large loose BM with head trauma.  Head is atraumatic, no CT no spine tenderness.  Scans have been ordered.  EKG with no signs of dysrhythmias or ischemia.  Will monitor on telemetry for signs of cardiac dysrhythmia.  We will check labs for anemia, dehydration, sepsis, electrolyte derangements.  Will place patient on telemetry for monitoring of cardiorespiratory status.  Old medical records reviewed.  _________________________ 7:12 AM on 03/12/2021 -----------------------------------------  CT showing several renal stones and a large bladder stone.  Also shows a L3 transverse process fracture but no other traumatic injuries on imaging studies.  Pain is well controlled.  Will send home on Tylenol and tramadol.  Patient urine is now clearing with just a very faint tinge of blood.  Patient remains hemodynamically stable with normal hemoglobin.  UA negative for urinary tract infection.  Sent for culture.  Labs showing no signs of dehydration, sepsis, electrolyte derangements, or anemia.  Discussed pain control and follow-up with PCP.  Discussed my standard return precautions.     ____________________________________________  Please note:  Patient was evaluated in Emergency Department today for the symptoms described in the history of present illness. Patient was evaluated in the  context of the global COVID-19 pandemic, which necessitated consideration that the patient might be at risk for infection with the SARS-CoV-2 virus that causes COVID-19. Institutional protocols and algorithms that pertain to the evaluation of patients at risk for COVID-19 are in a state of rapid change based on information released by regulatory bodies including  the CDC and federal and state organizations. These policies and algorithms were followed during the patient's care in the ED.  Some ED evaluations and interventions may be delayed as a result of limited staffing during the pandemic.   ____________________________________________   FINAL CLINICAL IMPRESSION(S) / ED DIAGNOSES   Final diagnoses:  Fall, initial encounter  Gross hematuria  Closed fracture of transverse process of lumbar vertebra, initial encounter (HCC)      NEW MEDICATIONS STARTED DURING THIS VISIT:  ED Discharge Orders         Ordered    acetaminophen (TYLENOL) 500 MG tablet  Every 8 hours PRN        03/12/21 0712    traMADol (ULTRAM) 50 MG tablet  Every 6 hours PRN        03/12/21 4540           Note:  This document was prepared using Dragon voice recognition software and may include unintentional dictation errors.    Nita Sickle, MD 03/12/21 [id number]

## 2021-03-12 NOTE — ED Triage Notes (Signed)
Patient reports he fell while transferring from his truck to his wheelchair. Then while going into his house the wheelchair caught on lip of threshold and the chair tipped back and his fell again.  Reports both times he landed on his back.  Patient reports urinating blood and having lower back pain since.

## 2021-03-13 LAB — URINE CULTURE: Culture: <10000 — AB

## 2021-06-18 ENCOUNTER — Other Ambulatory Visit: Payer: Self-pay

## 2021-06-18 ENCOUNTER — Emergency Department: Payer: Medicare Other

## 2021-06-18 ENCOUNTER — Encounter: Payer: Self-pay | Admitting: Radiology

## 2021-06-18 ENCOUNTER — Observation Stay
Admission: EM | Admit: 2021-06-18 | Discharge: 2021-06-19 | Disposition: A | Discharge status: Home / Self Care | Payer: Medicare Other | Attending: Internal Medicine | Admitting: Internal Medicine

## 2021-06-18 DIAGNOSIS — Z720 Tobacco use: Secondary | ICD-10-CM | POA: Diagnosis present

## 2021-06-18 DIAGNOSIS — Z79899 Other long term (current) drug therapy: Secondary | ICD-10-CM | POA: Diagnosis not present

## 2021-06-18 DIAGNOSIS — N390 Urinary tract infection, site not specified: Principal | ICD-10-CM | POA: Insufficient documentation

## 2021-06-18 DIAGNOSIS — F17210 Nicotine dependence, cigarettes, uncomplicated: Secondary | ICD-10-CM | POA: Insufficient documentation

## 2021-06-18 DIAGNOSIS — Z91040 Latex allergy status: Secondary | ICD-10-CM | POA: Diagnosis not present

## 2021-06-18 DIAGNOSIS — S24111S Complete lesion at T1 level of thoracic spinal cord, sequela: Secondary | ICD-10-CM

## 2021-06-18 DIAGNOSIS — G8220 Paraplegia, unspecified: Secondary | ICD-10-CM

## 2021-06-18 DIAGNOSIS — Z20822 Contact with and (suspected) exposure to covid-19: Secondary | ICD-10-CM | POA: Insufficient documentation

## 2021-06-18 DIAGNOSIS — S24111A Complete lesion at T1 level of thoracic spinal cord, initial encounter: Secondary | ICD-10-CM | POA: Diagnosis present

## 2021-06-18 DIAGNOSIS — B9620 Unspecified Escherichia coli [E. coli] as the cause of diseases classified elsewhere: Secondary | ICD-10-CM | POA: Insufficient documentation

## 2021-06-18 DIAGNOSIS — R197 Diarrhea, unspecified: Secondary | ICD-10-CM | POA: Diagnosis present

## 2021-06-18 DIAGNOSIS — R319 Hematuria, unspecified: Secondary | ICD-10-CM | POA: Diagnosis present

## 2021-06-18 HISTORY — DX: Calculus of kidney: N20.0

## 2021-06-18 HISTORY — DX: Acquired absence of right leg above knee: Z89.611

## 2021-06-18 HISTORY — DX: Autonomic dysreflexia: G90.4

## 2021-06-18 HISTORY — DX: Acquired absence of right leg above knee: Z89.612

## 2021-06-18 HISTORY — DX: Unspecified injury at T1 level of thoracic spinal cord, initial encounter: S24.101A

## 2021-06-18 HISTORY — DX: Tubulo-interstitial nephritis, not specified as acute or chronic: N12

## 2021-06-18 HISTORY — DX: Calculus in bladder: N21.0

## 2021-06-18 HISTORY — DX: Urinary tract infection, site not specified: N39.0

## 2021-06-18 LAB — URINALYSIS, COMPLETE (UACMP) WITH MICROSCOPIC
Bilirubin Urine: NEGATIVE
Glucose, UA: NEGATIVE mg/dL
Ketones, ur: NEGATIVE mg/dL
Nitrite: POSITIVE — AB
Protein, ur: >=300 mg/dL — AB
RBC / HPF: >50 RBC/hpf — ABNORMAL HIGH (ref 0–5)
Specific Gravity, Urine: 1.026 (ref 1.005–1.030)
WBC, UA: >50 WBC/hpf — ABNORMAL HIGH (ref 0–5)
pH: 5 (ref 5.0–8.0)

## 2021-06-18 LAB — COMPREHENSIVE METABOLIC PANEL
ALT: 30 U/L (ref 0–44)
AST: 26 U/L (ref 15–41)
Albumin: 3.9 g/dL (ref 3.5–5.0)
Alkaline Phosphatase: 108 U/L (ref 38–126)
Anion gap: 9 (ref 5–15)
BUN: 14 mg/dL (ref 6–20)
CO2: 24 mmol/L (ref 22–32)
Calcium: 9.1 mg/dL (ref 8.9–10.3)
Chloride: 107 mmol/L (ref 98–111)
Creatinine, Ser: 0.68 mg/dL (ref 0.61–1.24)
GFR, Estimated: >60 mL/min (ref >60)
Glucose, Bld: 94 mg/dL (ref 70–99)
Potassium: 4 mmol/L (ref 3.5–5.1)
Sodium: 140 mmol/L (ref 135–145)
Total Bilirubin: 0.6 mg/dL (ref 0.3–1.2)
Total Protein: 8 g/dL (ref 6.5–8.1)

## 2021-06-18 LAB — RESP PANEL BY RT-PCR (FLU A&B, COVID) ARPGX2
Influenza A by PCR: NEGATIVE
Influenza B by PCR: NEGATIVE
SARS Coronavirus 2 by RT PCR: NEGATIVE

## 2021-06-18 LAB — CBC
HCT: 42.8 % (ref 39.0–52.0)
Hemoglobin: 14.5 g/dL (ref 13.0–17.0)
MCH: 31.7 pg (ref 26.0–34.0)
MCHC: 33.9 g/dL (ref 30.0–36.0)
MCV: 93.7 fL (ref 80.0–100.0)
Platelets: 272 10*3/uL (ref 150–400)
RBC: 4.57 MIL/uL (ref 4.22–5.81)
RDW: 13.4 % (ref 11.5–15.5)
WBC: 4.7 10*3/uL (ref 4.0–10.5)
nRBC: 0 % (ref 0.0–0.2)

## 2021-06-18 LAB — LACTIC ACID, PLASMA: Lactic Acid, Venous: 1.5 mmol/L (ref 0.5–1.9)

## 2021-06-18 MED ORDER — FENTANYL CITRATE PF 50 MCG/ML IJ SOSY
25.0000 ug | PREFILLED_SYRINGE | INTRAMUSCULAR | Status: DC | PRN
  Administered 2021-06-18 (×2): 25 ug via INTRAVENOUS
  Filled 2021-06-18 (×2): qty 1

## 2021-06-18 MED ORDER — SODIUM CHLORIDE 0.9 % IV SOLN
1.0000 g | Freq: Once | INTRAVENOUS | Status: AC
  Administered 2021-06-18: 1 g via INTRAVENOUS
  Filled 2021-06-18: qty 10

## 2021-06-18 MED ORDER — TAMSULOSIN HCL 0.4 MG PO CAPS
0.4000 mg | ORAL_CAPSULE | Freq: Every day | ORAL | Status: DC
  Administered 2021-06-18 – 2021-06-19 (×2): 0.4 mg via ORAL
  Filled 2021-06-18 (×2): qty 1

## 2021-06-18 MED ORDER — SODIUM CHLORIDE 0.9 % IV SOLN
1.0000 g | INTRAVENOUS | Status: DC
  Filled 2021-06-18: qty 10

## 2021-06-18 MED ORDER — IOHEXOL 350 MG/ML SOLN
100.0000 mL | Freq: Once | INTRAVENOUS | Status: AC | PRN
  Administered 2021-06-18: 100 mL via INTRAVENOUS

## 2021-06-18 MED ORDER — FENTANYL CITRATE PF 50 MCG/ML IJ SOSY
50.0000 ug | PREFILLED_SYRINGE | Freq: Once | INTRAMUSCULAR | Status: AC
Start: 2021-06-18 — End: 2021-06-18
  Administered 2021-06-18: 50 ug via INTRAVENOUS
  Filled 2021-06-18: qty 1

## 2021-06-18 MED ORDER — FENTANYL CITRATE (PF) 100 MCG/2ML IJ SOLN
25.0000 ug | INTRAMUSCULAR | Status: DC | PRN
  Administered 2021-06-18 – 2021-06-19 (×4): 25 ug via INTRAVENOUS
  Filled 2021-06-18 (×4): qty 2

## 2021-06-18 MED ORDER — PHENAZOPYRIDINE HCL 200 MG PO TABS
200.0000 mg | ORAL_TABLET | Freq: Three times a day (TID) | ORAL | Status: DC
  Administered 2021-06-18 – 2021-06-19 (×2): 200 mg via ORAL
  Filled 2021-06-18 (×5): qty 1

## 2021-06-18 MED ORDER — NICOTINE 21 MG/24HR TD PT24
21.0000 mg | MEDICATED_PATCH | Freq: Every day | TRANSDERMAL | Status: DC
  Filled 2021-06-18: qty 1

## 2021-06-18 MED ORDER — SODIUM CHLORIDE 0.9 % IV BOLUS
1000.0000 mL | Freq: Once | INTRAVENOUS | Status: AC
  Administered 2021-06-18: 1000 mL via INTRAVENOUS

## 2021-06-18 MED ORDER — LACTATED RINGERS IV BOLUS
1000.0000 mL | Freq: Once | INTRAVENOUS | Status: AC
  Administered 2021-06-18: 1000 mL via INTRAVENOUS

## 2021-06-18 MED ORDER — ACETAMINOPHEN 325 MG PO TABS: 650.0000 mg | ORAL_TABLET | Freq: Four times a day (QID) | ORAL | Status: DC | PRN

## 2021-06-18 MED ORDER — FENTANYL CITRATE PF 50 MCG/ML IJ SOSY
50.0000 ug | PREFILLED_SYRINGE | Freq: Once | INTRAMUSCULAR | Status: AC
  Administered 2021-06-18: 50 ug via INTRAVENOUS
  Filled 2021-06-18: qty 1

## 2021-06-18 MED ORDER — ONDANSETRON HCL 4 MG/2ML IJ SOLN: 4.0000 mg | Freq: Three times a day (TID) | INTRAMUSCULAR | Status: DC | PRN

## 2021-06-18 MED ORDER — PHENAZOPYRIDINE HCL 200 MG PO TABS
200.0000 mg | ORAL_TABLET | Freq: Three times a day (TID) | ORAL | Status: DC
  Filled 2021-06-18 (×2): qty 1

## 2021-06-18 MED ORDER — FLAVOXATE HCL 100 MG PO TABS
100.0000 mg | ORAL_TABLET | Freq: Three times a day (TID) | ORAL | Status: DC | PRN
  Filled 2021-06-18 (×2): qty 1

## 2021-06-18 NOTE — ED Notes (Signed)
Pt given blanket.

## 2021-06-18 NOTE — ED Notes (Signed)
Lab called to come obtain blood cultures and lactic due to pt being very difficult stick
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
## 2021-06-18 NOTE — ED Notes (Signed)
This RN and Velva Harman in room to place foley. This RN inserted 16 f foley catheter into patient without difficulty. Pt self caths with 16 f catheters. Pt urinating around catheter and started have severe bladder spasms. Pt became diaphoretic and HR dropped to 37. Pt HR started to return to normal and is currently 58. Pt still diaphoretic. Foley catheter was taken out and pain started to subside. Pt given PRN pain medication and message sent to Dr. Clyde Lundborg.

## 2021-06-18 NOTE — ED Notes (Signed)
Pt reports that he is currently being treated for a UTI with Macrobid. Pt self caths at home but states that he has not been able to get the catheter in all the way. Pt states that he feels like he is not able to empty his bladder all the way. Pt states that it is very painful for him when he tries to pass urine. Pt reports feeling like he had a fever but he has not checked it. Pt report blood in his urine as well.

## 2021-06-18 NOTE — ED Notes (Signed)
Lab at bedside

## 2021-06-18 NOTE — ED Provider Notes (Signed)
[facility] Emergency Department Provider Note  ____________________________________________   I have reviewed the triage vital signs and the nursing notes.   HISTORY  Chief Complaint bladder retention   History limited by: Not Limited   HPI [name] is a 35 y.o. male who presents to the emergency department today because of concern for urinary issues. The patient states that he normally self caths but recently has had a hard time getting urine return when he has been self cathing. He has noticed that after he attempts some urine will come out. The urine has appeared bloody. Discussed with his PCP who started him on antibiotics for UTI however the issue has persisted. The patient has had associated pain in his lower abdomen as well as sweats. He states he has been septic in the past from UTIs.    Records reviewed. Per medical record review patient has a history of UTI.  No past medical history on file.  Patient Active Problem List   Diagnosis Date Noted   Chronic osteomyelitis with draining sinus (HCC) 12/15/2020   Chronic pain 12/15/2020   ED (erectile dysfunction) 12/15/2020   Neurogenic bowel 12/15/2020   Paraplegia (HCC) 12/15/2020   Constipation 01/21/2018   History of MRSA infection 01/15/2018   Obesity (BMI 30-39.9) 12/30/2017   Heterotopic ossification of bone 10/31/2017   Acute cystitis with hematuria 10/10/2017   Chronic nausea 10/10/2017   Frequent urination 10/10/2017   Depression 09/20/2016   Suicidal ideation 09/20/2016   Acute UTI 09/04/2016   Headache, tension-type 09/04/2016   Autonomic dysreflexia 03/10/2016   Pyelonephritis 03/10/2016   Pain, chronic due to trauma 03/05/2014   S/P AKA (above knee amputation) unilateral (HCC) 10/30/2013   Back pain 05/09/2013   Nephrolithiasis 03/19/2013   History of syncope 03/18/2013   Chronic anemia 02/05/2013   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 Authorizing Provider  acetaminophen (TYLENOL) 500 MG tablet Take 2 tablets (1,000 mg total) by mouth every 8 (eight) hours as needed for mild pain, moderate pain, fever or headache. 03/12/21 03/12/22  Don Perking, [place], MD  traMADol (ULTRAM) 50 MG tablet Take 1 tablet (50 mg total) by mouth every 6 (six) hours as needed. 03/12/21 03/12/22  Nita Sickle, MD    Allergies Ketorolac, Oxycodone, Ciprofloxacin, Coconut oil, Tobramycin, Cefdinir, Latex, Other, and Piperacillin-tazobactam in dex  No family history on file.  Social History    Review of Systems Constitutional: No fever/chills Eyes: No visual changes. ENT: No sore throat. Cardiovascular: Denies chest pain. Respiratory: Denies shortness of breath. Gastrointestinal: Positive for lower abdominal pain. Positive for left flank pain. Genitourinary: Positive for bad odor, hematuria.  Musculoskeletal: Negative for back pain. Skin: Negative for rash. Neurological: Negative for headaches, focal weakness or numbness.  ____________________________________________   PHYSICAL EXAM:  VITAL SIGNS: ED Triage Vitals  Enc Vitals Group     BP 06/18/21 0751 (!) 219/120     Pulse Rate 06/18/21 0751 (!) 47     Resp 06/18/21 0751 18     Temp 06/18/21 0751 98.6 F (37 C)     Temp Source 06/18/21 0751 Oral     SpO2 06/18/21 0751 100 %     Weight 06/18/21 0752 200 lb (90.7 kg)     Height --      Head Circumference --      Peak Flow --  Pain Score 06/18/21 0752 10   Constitutional: Alert and oriented.  Eyes: Conjunctivae are normal.  ENT      Head: Normocephalic and atraumatic.      Nose: No congestion/rhinnorhea.      Mouth/Throat: Mucous membranes are moist.      Neck: No stridor. Hematological/Lymphatic/Immunilogical: No cervical lymphadenopathy. Cardiovascular: Normal rate, regular rhythm.  No murmurs, rubs, or gallops.  Respiratory: Normal  respiratory effort without tachypnea nor retractions. Breath sounds are clear and equal bilaterally. No wheezes/rales/rhonchi. Gastrointestinal: Soft and tender to palpation in the left abdomen. Genitourinary: Deferred Musculoskeletal: s/p bilateral AKAs Neurologic:  Normal speech and language. No gross focal neurologic deficits are appreciated.  Skin:  Skin is warm, dry and intact. No rash noted. Psychiatric: Mood and affect are normal. Speech and behavior are normal. Patient exhibits appropriate insight and judgment.  ____________________________________________    LABS (pertinent positives/negatives)  COVID negative Lactic acid 1.5 CBC wbc 4.7, hgb 14.5, plt 272 CMP wnl UA cloudy, large hgb dipstick, positive nitrite, moderate leukocytes, >50 RBC and WBC, many bacteria  ____________________________________________   EKG  I, Phineas Semen, attending physician, personally viewed and interpreted this EKG  EKG Time: 1012 Rate: 65 Rhythm: sinus rhythm Axis: normal Intervals: qtc 409 QRS: narrow ST changes: no st elevation, t wave inversion III Impression: abnormal ekg   ____________________________________________    RADIOLOGY  CXR No acute abnormality  CT abd/pel No acute abnormality  ____________________________________________   PROCEDURES  Procedures  ____________________________________________   INITIAL IMPRESSION / ASSESSMENT AND PLAN / ED COURSE  Pertinent labs & imaging results that were available during my care of the patient were reviewed by me and considered in my medical decision making (see chart for details).   Patient presented to the emergency department today because of concerns for abdominal pain and possible urinary tract infection.  Patient's urine does appear infected here.  He had already started antibiotics as an outpatient with his physician.  Given that he is feeling worse and urine continues to look infected will plan on  admission for IV antibiotics.  Did obtain CT scan to make sure there is no infected stone or other concerning intra abdominal abnormality.  ____________________________________________   FINAL CLINICAL IMPRESSION(S) / ED DIAGNOSES  Final diagnoses:  Lower urinary tract infection     Note: This dictation was prepared with Dragon dictation. Any transcriptional errors that result from this process are unintentional     Phineas Semen, MD 06/18/21 1451

## 2021-06-18 NOTE — ED Notes (Signed)
Dr. Clyde Lundborg at bedside seeing pt

## 2021-06-18 NOTE — ED Notes (Signed)
IV team at bedside 

## 2021-06-18 NOTE — ED Notes (Signed)
MD goodman at bedside with RN to evaluate pt, pt diaphoretic and c/o pain in the left side of abdomen. Vital signs obtained and EDP to place orders for CT and pain medication.

## 2021-06-18 NOTE — ED Notes (Signed)
Patient transported to CT 

## 2021-06-18 NOTE — ED Triage Notes (Signed)
Pt comes pov with lower bladder pain. Normally self-caths but has been unable to or urine comes out when he's not cathing. States there is blood in urine. Has had UTIs before but states this is worse. Pt is double amputee and uses wheelchair.

## 2021-06-19 DIAGNOSIS — N390 Urinary tract infection, site not specified: Secondary | ICD-10-CM | POA: Diagnosis not present

## 2021-06-19 LAB — CBC
HCT: 40.9 % (ref 39.0–52.0)
Hemoglobin: 13.7 g/dL (ref 13.0–17.0)
MCH: 30.7 pg (ref 26.0–34.0)
MCHC: 33.5 g/dL (ref 30.0–36.0)
MCV: 91.7 fL (ref 80.0–100.0)
Platelets: 252 10*3/uL (ref 150–400)
RBC: 4.46 MIL/uL (ref 4.22–5.81)
RDW: 13.2 % (ref 11.5–15.5)
WBC: 5.5 10*3/uL (ref 4.0–10.5)
nRBC: 0 % (ref 0.0–0.2)

## 2021-06-19 LAB — MRSA NEXT GEN BY PCR, NASAL: MRSA by PCR Next Gen: NOT DETECTED

## 2021-06-19 LAB — HIV ANTIBODY (ROUTINE TESTING W REFLEX): HIV Screen 4th Generation wRfx: NONREACTIVE

## 2021-06-19 MED ORDER — PHENAZOPYRIDINE HCL 200 MG PO TABS: 200.0000 mg | ORAL_TABLET | Freq: Three times a day (TID) | ORAL | 0 refills | Status: DC

## 2021-06-19 MED ORDER — TAMSULOSIN HCL 0.4 MG PO CAPS
0.4000 mg | ORAL_CAPSULE | Freq: Every day | ORAL | 0 refills | Status: DC
Start: 2021-06-20 — End: 2021-08-14

## 2021-06-19 MED ORDER — SULFAMETHOXAZOLE-TRIMETHOPRIM 800-160 MG PO TABS: 1.0000 | ORAL_TABLET | Freq: Two times a day (BID) | ORAL | 0 refills | Status: AC

## 2021-06-19 MED ORDER — SULFAMETHOXAZOLE-TRIMETHOPRIM 800-160 MG PO TABS
1.0000 | ORAL_TABLET | Freq: Two times a day (BID) | ORAL | Status: DC
  Administered 2021-06-19: 1 via ORAL
  Filled 2021-06-19 (×2): qty 1

## 2021-06-19 MED ORDER — METHOCARBAMOL 500 MG PO TABS: 500.0000 mg | ORAL_TABLET | Freq: Four times a day (QID) | ORAL | 0 refills | Status: AC

## 2021-06-19 MED ORDER — BELLADONNA ALKALOIDS-OPIUM 16.2-30 MG RE SUPP
0.5000 | Freq: Four times a day (QID) | RECTAL | Status: DC | PRN
  Filled 2021-06-19: qty 1
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
## 2021-06-19 NOTE — Progress Notes (Signed)
Patient given instructions to keep follow up appointment, when to return for worsening symptoms, Bactrim education given, IV taken out by the patient, tele discontinued, & patient discharged via personal wheelchair.

## 2021-06-19 NOTE — Plan of Care (Signed)
Pt while urinating, had severe bladder spasms,abdominal pain and headache. HR drops to 40s, pt diaphoretic. Fentanyl given with short term relief.

## 2021-06-20 LAB — URINE CULTURE: Culture: >=100000 — AB

## 2021-06-23 LAB — CULTURE, BLOOD (ROUTINE X 2)
Culture: NO GROWTH
Culture: NO GROWTH
Special Requests: ADEQUATE
Special Requests: ADEQUATE

## 2021-08-14 ENCOUNTER — Emergency Department: Payer: Medicare Other

## 2021-08-14 ENCOUNTER — Encounter: Payer: Self-pay | Admitting: Emergency Medicine

## 2021-08-14 ENCOUNTER — Other Ambulatory Visit: Payer: Self-pay

## 2021-08-14 ENCOUNTER — Emergency Department
Admission: EM | Admit: 2021-08-14 | Discharge: 2021-08-14 | Disposition: A | Discharge status: Home / Self Care | Payer: Medicare Other | Attending: Emergency Medicine | Admitting: Emergency Medicine

## 2021-08-14 DIAGNOSIS — F17210 Nicotine dependence, cigarettes, uncomplicated: Secondary | ICD-10-CM | POA: Diagnosis not present

## 2021-08-14 DIAGNOSIS — R109 Unspecified abdominal pain: Secondary | ICD-10-CM | POA: Diagnosis present

## 2021-08-14 DIAGNOSIS — N202 Calculus of kidney with calculus of ureter: Secondary | ICD-10-CM | POA: Diagnosis not present

## 2021-08-14 DIAGNOSIS — Z91040 Latex allergy status: Secondary | ICD-10-CM | POA: Insufficient documentation

## 2021-08-14 DIAGNOSIS — N390 Urinary tract infection, site not specified: Secondary | ICD-10-CM | POA: Insufficient documentation

## 2021-08-14 DIAGNOSIS — R310 Gross hematuria: Secondary | ICD-10-CM

## 2021-08-14 DIAGNOSIS — N200 Calculus of kidney: Secondary | ICD-10-CM

## 2021-08-14 LAB — HEPATIC FUNCTION PANEL
ALT: 30 U/L (ref 0–44)
AST: 39 U/L (ref 15–41)
Albumin: 3.9 g/dL (ref 3.5–5.0)
Alkaline Phosphatase: 119 U/L (ref 38–126)
Bilirubin, Direct: <0.1 mg/dL (ref 0.0–0.2)
Total Bilirubin: 0.6 mg/dL (ref 0.3–1.2)
Total Protein: 7.9 g/dL (ref 6.5–8.1)

## 2021-08-14 LAB — CBC
HCT: 40.5 % (ref 39.0–52.0)
Hemoglobin: 13.9 g/dL (ref 13.0–17.0)
MCH: 32 pg (ref 26.0–34.0)
MCHC: 34.3 g/dL (ref 30.0–36.0)
MCV: 93.3 fL (ref 80.0–100.0)
Platelets: 246 10*3/uL (ref 150–400)
RBC: 4.34 MIL/uL (ref 4.22–5.81)
RDW: 13.1 % (ref 11.5–15.5)
WBC: 5.2 10*3/uL (ref 4.0–10.5)
nRBC: 0 % (ref 0.0–0.2)

## 2021-08-14 LAB — URINALYSIS, ROUTINE W REFLEX MICROSCOPIC
Bilirubin Urine: NEGATIVE
Glucose, UA: NEGATIVE mg/dL
Ketones, ur: NEGATIVE mg/dL
Nitrite: NEGATIVE
Protein, ur: >=300 mg/dL — AB
RBC / HPF: >50 RBC/hpf — ABNORMAL HIGH (ref 0–5)
Specific Gravity, Urine: 1.022 (ref 1.005–1.030)
Squamous Epithelial / HPF: NONE SEEN (ref 0–5)
WBC, UA: >50 WBC/hpf — ABNORMAL HIGH (ref 0–5)
pH: 6 (ref 5.0–8.0)

## 2021-08-14 LAB — APTT: aPTT: 35 seconds (ref 24–36)

## 2021-08-14 LAB — BASIC METABOLIC PANEL
Anion gap: 8 (ref 5–15)
BUN: 25 mg/dL — ABNORMAL HIGH (ref 6–20)
CO2: 23 mmol/L (ref 22–32)
Calcium: 9.3 mg/dL (ref 8.9–10.3)
Chloride: 108 mmol/L (ref 98–111)
Creatinine, Ser: 1.23 mg/dL (ref 0.61–1.24)
GFR, Estimated: >60 mL/min (ref >60)
Glucose, Bld: 129 mg/dL — ABNORMAL HIGH (ref 70–99)
Potassium: 3.9 mmol/L (ref 3.5–5.1)
Sodium: 139 mmol/L (ref 135–145)

## 2021-08-14 LAB — LACTIC ACID, PLASMA: Lactic Acid, Venous: 1.3 mmol/L (ref 0.5–1.9)

## 2021-08-14 LAB — PROTIME-INR
INR: 1 (ref 0.8–1.2)
Prothrombin Time: 13.5 seconds (ref 11.4–15.2)

## 2021-08-14 LAB — TROPONIN I (HIGH SENSITIVITY): Troponin I (High Sensitivity): 7 ng/L (ref <18)

## 2021-08-14 MED ORDER — TAMSULOSIN HCL 0.4 MG PO CAPS: 0.4000 mg | ORAL_CAPSULE | Freq: Every day | ORAL | 3 refills | Status: AC

## 2021-08-14 MED ORDER — OXYCODONE-ACETAMINOPHEN 5-325 MG PO TABS: 1.0000 | ORAL_TABLET | ORAL | 0 refills | Status: AC | PRN

## 2021-08-14 MED ORDER — SULFAMETHOXAZOLE-TRIMETHOPRIM 800-160 MG PO TABS: 1.0000 | ORAL_TABLET | Freq: Two times a day (BID) | ORAL | 0 refills | Status: DC

## 2021-08-14 MED ORDER — MORPHINE SULFATE (PF) 4 MG/ML IV SOLN
4.0000 mg | Freq: Once | INTRAVENOUS | Status: AC
  Administered 2021-08-14: 4 mg via INTRAVENOUS
  Filled 2021-08-14: qty 1

## 2021-08-14 MED ORDER — ONDANSETRON HCL 4 MG/2ML IJ SOLN
4.0000 mg | Freq: Once | INTRAMUSCULAR | Status: AC
  Administered 2021-08-14: 4 mg via INTRAVENOUS
  Filled 2021-08-14: qty 2

## 2021-08-14 MED ORDER — SODIUM CHLORIDE 0.9 % IV SOLN
1.0000 g | Freq: Once | INTRAVENOUS | Status: AC
  Administered 2021-08-14: 1 g via INTRAVENOUS
  Filled 2021-08-14: qty 10

## 2021-08-14 NOTE — Discharge Instructions (Signed)
Follow up with your regular doctor if not improving in 3 days Return to the ER if worsening 

## 2021-08-14 NOTE — ED Triage Notes (Signed)
Pt arrived via POV with reports of hematuria x 2 days and chest pain that started tonight. Pt states he self-caths and noted blood with clots over the past 2 days.

## 2021-08-14 NOTE — ED Notes (Signed)
Blue and red top tube sent to lab with save labels 

## 2021-08-14 NOTE — ED Notes (Signed)
See triage note  presents with blood in urine  states he self caths and noticed blood with clots 2 days ago  no fever  also having some discomfort in chest

## 2021-08-14 NOTE — ED Provider Notes (Signed)
[facility] Emergency Department Provider Note  ____________________________________________   Event Date/Time   First MD Initiated Contact with Patient 08/14/21 [phone]     (approximate)  I have reviewed the triage vital signs and the nursing notes.   HISTORY  Chief Complaint Hematuria and Chest Pain    HPI [name] is a 35 y.o. male presents emergency department complaining of hematuria and flank pain that radiates up into his chest.  States he took a leftover Bactrim yesterday and Flomax.  Patient does self cath and is confined to wheelchair.  He has above-the-knee amputations.  He denies any fever or chills.  No shortness of breath.  No vomiting or diarrhea.  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/15/2020   Chronic pain 12/15/2020   ED (erectile dysfunction) 12/15/2020   Neurogenic bowel 12/15/2020   Paraplegia (HCC) 12/15/2020   Constipation 01/21/2018   History of MRSA infection 01/15/2018   Obesity (BMI 30-39.9) 12/30/2017   Heterotopic ossification of bone 10/31/2017   Acute cystitis with hematuria 10/10/2017   Chronic nausea 10/10/2017   Frequent urination 10/10/2017   Depression 09/20/2016   Suicidal ideation 09/20/2016   Acute UTI 09/04/2016   Headache, tension-type 09/04/2016   Autonomic dysreflexia 03/10/2016   Pyelonephritis 03/10/2016   Pain, chronic due to trauma 03/05/2014   S/P AKA (above knee amputation) unilateral (HCC) 10/30/2013   Back pain 05/09/2013   Nephrolithiasis 03/19/2013   History of syncope  03/18/2013   Chronic anemia 02/05/2013   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 Authorizing Provider  sulfamethoxazole-trimethoprim (BACTRIM DS) 800-160 MG tablet Take 1 tablet by mouth 2 (two) times daily. 08/14/21  Yes Brennin Durfee, Roselyn Bering, PA-C  tamsulosin (FLOMAX) 0.4 MG CAPS capsule Take 1 capsule (0.4 mg total) by mouth daily. 08/14/21  Yes Elivia Robotham, Roselyn Bering, PA-C  acetaminophen (TYLENOL) 500 MG tablet Take 2 tablets (1,000 mg total) by mouth every 8 (eight) hours as needed for mild pain, moderate pain, fever or headache. 03/12/21 03/12/22  Nita Sickle, MD  phenazopyridine (PYRIDIUM) 200 MG tablet Take 1 tablet (200 mg total) by mouth 3 (three) times daily with meals. 06/19/21   Tresa Moore, MD    Allergies Ciprofloxacin, Coconut flavor, Ketorolac, Oxycodone, Coconut oil, Tobramycin, Cefdinir, Latex, Other, Piperacillin-tazobactam in dex, and Tape  Family History  Problem Relation Age of Onset   Colon cancer Mother     Social History Social History   Tobacco Use   Smoking status: Every Day    Types: Cigarettes   Smokeless tobacco: Never  Substance Use Topics   Alcohol use: Yes   Drug use: Yes    Types: Marijuana    Review of Systems  Constitutional: No fever/chills Eyes: No visual changes. ENT: No sore throat. Respiratory: Denies cough Cardiovascular: Positive  chest pain Gastrointestinal: Positive abdominal pain Genitourinary: Negative for dysuria.  Positive hematuria Musculoskeletal: Negative for back pain. Skin: Negative for rash. Psychiatric: no mood changes,     ____________________________________________   PHYSICAL EXAM:  VITAL SIGNS: ED Triage Vitals  Enc Vitals Group     BP 08/14/21 0432 108/74     Pulse Rate 08/14/21 0432 89     Resp  08/14/21 0432 18     Temp 08/14/21 0432 98.2 F (36.8 C)     Temp Source 08/14/21 0432 Oral     SpO2 08/14/21 0432 98 %     Weight 08/14/21 0430 187 lb (84.8 kg)     Height 08/14/21 0430 6\' 5"  (1.956 m)     Head Circumference --      Peak Flow --      Pain Score 08/14/21 0437 6     Pain Loc --      Pain Edu? --      Excl. in GC? --     Constitutional: Alert and oriented. Well appearing and in no acute distress. Eyes: Conjunctivae are normal.  Head: Atraumatic. Nose: No congestion/rhinnorhea. Mouth/Throat: Mucous membranes are moist.   Neck:  supple no lymphadenopathy noted Cardiovascular: Normal rate, regular rhythm. Heart sounds are normal Respiratory: Normal respiratory effort.  No retractions, lungs c t a  Abd: soft nontender bs normal all 4 quad GU: deferred Musculoskeletal: FROM all extremities, warm and well perfused Neurologic:  Normal speech and language.  Skin:  Skin is warm, dry and intact. No rash noted. Psychiatric: Mood and affect are normal. Speech and behavior are normal.  ____________________________________________   LABS (all labs ordered are listed, but only abnormal results are displayed)  Labs Reviewed  BASIC METABOLIC PANEL - Abnormal; Notable for the following components:      Result Value   Glucose, Bld 129 (*)    BUN 25 (*)    All other components within normal limits  URINALYSIS, ROUTINE W REFLEX MICROSCOPIC - Abnormal; Notable for the following components:   Color, Urine RED (*)    APPearance CLOUDY (*)    Hgb urine dipstick LARGE (*)    Protein, ur >=300 (*)    Leukocytes,Ua MODERATE (*)    RBC / HPF >50 (*)    WBC, UA >50 (*)    Bacteria, UA RARE (*)    All other components within normal limits  CULTURE, BLOOD (SINGLE)  URINE CULTURE  CBC  LACTIC ACID, PLASMA  HEPATIC FUNCTION PANEL  LACTIC ACID, PLASMA  PROTIME-INR  APTT  TROPONIN I (HIGH SENSITIVITY)    ____________________________________________   ____________________________________________  RADIOLOGY  CT renal stone  ____________________________________________   PROCEDURES  Procedure(s) performed: Rocephin 1 g IV   Procedures    ____________________________________________   INITIAL IMPRESSION / ASSESSMENT AND PLAN / ED COURSE  Pertinent labs & imaging results that were available during my care of the patient were reviewed by me and considered in my medical decision making (see chart for details).   The patient is a 35 year old male presents emergency department with hematuria and flank pain that radiates into the chest.  See HPI.  Physical exam shows patient appears stable at this time  DDx: UTI, prostatitis, kidney stone, pyelonephritis  Labs that were done per protocols in triage are reassuring.  CBC, basic metabolic panel, troponin are normal  UA shows large amount hemoglobin, greater than 300 protein, moderate leuks, greater than 50 WBCs greater than 50 RBCs  CT renal stone shows  a nonobstructing kidney stone. Chest x-ray reviewed by me confirmed by radiology to be negative  EKG shows normal sinus rhythm, see physician read  In looking at the patient's past medical history and allergies, infectious diseases noted he does tolerate ceftriaxone.  We will give him Rocephin 1 g IV.  Will discharge with prescription for Bactrim.  Lactic acid is normal.  Patient states he is able to see his doctor either later this afternoon or tomorrow.  Encouraged him to follow-up.  Gave a prescription for Bactrim twice daily, Flomax.  He was given strict instructions to return if worsening.  Discharged in stable condition  [name] was evaluated in Emergency Department on 08/14/2021 for the symptoms described in the history of present illness. He was evaluated in the context of the global COVID-19 pandemic, which necessitated consideration that the patient might be at  risk for infection with the SARS-CoV-2 virus that causes COVID-19. Institutional protocols and algorithms that pertain to the evaluation of patients at risk for COVID-19 are in a state of rapid change based on information released by regulatory bodies including the CDC and federal and state organizations. These policies and algorithms were followed during the patient's care in the ED.    As part of my medical decision making, I reviewed the following data within the electronic MEDICAL RECORD NUMBER Nursing notes reviewed and incorporated, Labs reviewed , EKG interpreted NSR, Old chart reviewed, Radiograph reviewed , Evaluated by EM attending Dr. Katrinka Blazing, Notes from prior ED visits, and [place] Controlled Substance Database  ____________________________________________   FINAL CLINICAL IMPRESSION(S) / ED DIAGNOSES  Final diagnoses:  Acute UTI  Renal stone  Gross hematuria      NEW MEDICATIONS STARTED DURING THIS VISIT:  New Prescriptions   SULFAMETHOXAZOLE-TRIMETHOPRIM (BACTRIM DS) 800-160 MG TABLET    Take 1 tablet by mouth 2 (two) times daily.   TAMSULOSIN (FLOMAX) 0.4 MG CAPS CAPSULE    Take 1 capsule (0.4 mg total) by mouth daily.     Note:  This document was prepared using Dragon voice recognition software and may include unintentional dictation errors.    Faythe Ghee, PA-C 08/14/21 1132    Gilles Chiquito, MD 08/14/21 1226

## 2021-08-16 LAB — URINE CULTURE: Culture: 40000 — AB

## 2021-08-17 ENCOUNTER — Emergency Department
Admission: EM | Admit: 2021-08-17 | Discharge: 2021-08-17 | Disposition: A | Discharge status: Home / Self Care | Payer: Medicare Other | Attending: Emergency Medicine | Admitting: Emergency Medicine

## 2021-08-17 ENCOUNTER — Other Ambulatory Visit (HOSPITAL_COMMUNITY): Payer: Self-pay

## 2021-08-17 ENCOUNTER — Other Ambulatory Visit: Payer: Self-pay

## 2021-08-17 ENCOUNTER — Encounter: Payer: Self-pay | Admitting: Emergency Medicine

## 2021-08-17 DIAGNOSIS — F17210 Nicotine dependence, cigarettes, uncomplicated: Secondary | ICD-10-CM | POA: Insufficient documentation

## 2021-08-17 DIAGNOSIS — N390 Urinary tract infection, site not specified: Secondary | ICD-10-CM

## 2021-08-17 DIAGNOSIS — R630 Anorexia: Secondary | ICD-10-CM | POA: Insufficient documentation

## 2021-08-17 DIAGNOSIS — N3001 Acute cystitis with hematuria: Secondary | ICD-10-CM | POA: Diagnosis not present

## 2021-08-17 DIAGNOSIS — Z91040 Latex allergy status: Secondary | ICD-10-CM | POA: Diagnosis not present

## 2021-08-17 DIAGNOSIS — R319 Hematuria, unspecified: Secondary | ICD-10-CM

## 2021-08-17 DIAGNOSIS — R109 Unspecified abdominal pain: Secondary | ICD-10-CM | POA: Diagnosis present

## 2021-08-17 DIAGNOSIS — R6883 Chills (without fever): Secondary | ICD-10-CM | POA: Insufficient documentation

## 2021-08-17 LAB — LACTIC ACID, PLASMA
Lactic Acid, Venous: 2 mmol/L (ref 0.5–1.9)
Lactic Acid, Venous: 1.3 mmol/L (ref 0.5–1.9)

## 2021-08-17 LAB — CBC WITH DIFFERENTIAL/PLATELET
Abs Immature Granulocytes: 0.01 10*3/uL (ref 0.00–0.07)
Basophils Absolute: 0 10*3/uL (ref 0.0–0.1)
Basophils Relative: 1 %
Eosinophils Absolute: 0.3 10*3/uL (ref 0.0–0.5)
Eosinophils Relative: 6 %
HCT: 39 % (ref 39.0–52.0)
Hemoglobin: 13.5 g/dL (ref 13.0–17.0)
Immature Granulocytes: 0 %
Lymphocytes Relative: 46 %
Lymphs Abs: 2.4 10*3/uL (ref 0.7–4.0)
MCH: 32 pg (ref 26.0–34.0)
MCHC: 34.6 g/dL (ref 30.0–36.0)
MCV: 92.4 fL (ref 80.0–100.0)
Monocytes Absolute: 0.3 10*3/uL (ref 0.1–1.0)
Monocytes Relative: 6 %
Neutro Abs: 2.1 10*3/uL (ref 1.7–7.7)
Neutrophils Relative %: 41 %
Platelets: 253 10*3/uL (ref 150–400)
RBC: 4.22 MIL/uL (ref 4.22–5.81)
RDW: 13.2 % (ref 11.5–15.5)
WBC: 5.2 10*3/uL (ref 4.0–10.5)
nRBC: 0 % (ref 0.0–0.2)

## 2021-08-17 LAB — BASIC METABOLIC PANEL
Anion gap: 7 (ref 5–15)
BUN: 18 mg/dL (ref 6–20)
CO2: 24 mmol/L (ref 22–32)
Calcium: 9 mg/dL (ref 8.9–10.3)
Chloride: 107 mmol/L (ref 98–111)
Creatinine, Ser: 0.89 mg/dL (ref 0.61–1.24)
GFR, Estimated: >60 mL/min (ref >60)
Glucose, Bld: 131 mg/dL — ABNORMAL HIGH (ref 70–99)
Potassium: 3.7 mmol/L (ref 3.5–5.1)
Sodium: 138 mmol/L (ref 135–145)

## 2021-08-17 MED ORDER — CLINDAMYCIN PHOSPHATE 300 MG/50ML IV SOLN
300.0000 mg | Freq: Once | INTRAVENOUS | Status: AC
  Administered 2021-08-17: 300 mg via INTRAVENOUS
  Filled 2021-08-17: qty 50

## 2021-08-17 MED ORDER — CLINDAMYCIN HCL 300 MG PO CAPS: 300.0000 mg | ORAL_CAPSULE | Freq: Three times a day (TID) | ORAL | 0 refills | Status: AC

## 2021-08-17 MED ORDER — SODIUM CHLORIDE 0.9 % IV BOLUS
1000.0000 mL | Freq: Once | INTRAVENOUS | Status: AC
  Administered 2021-08-17: 1000 mL via INTRAVENOUS

## 2021-08-17 MED ORDER — FENTANYL CITRATE PF 50 MCG/ML IJ SOSY
50.0000 ug | PREFILLED_SYRINGE | Freq: Once | INTRAMUSCULAR | Status: AC
Start: 2021-08-17 — End: 2021-08-17
  Administered 2021-08-17: 50 ug via INTRAVENOUS
  Filled 2021-08-17: qty 1

## 2021-08-17 NOTE — ED Notes (Signed)
This RN attempted IV once, pt. States he is normally gets an US guided IV. This RN contacted Eliberto Ivory, RN to attempt US guided IV.

## 2021-08-17 NOTE — ED Triage Notes (Signed)
Pt reports was seen here and given an antibiotic but then received a call that he needed to come back in and be given a new antibiotic because the one prescribed was not going to work.  Pt reports still having pain

## 2021-08-17 NOTE — ED Notes (Signed)
Pt. Unable to reach signature pad. Verbal consent discharge accepted.

## 2021-08-17 NOTE — Progress Notes (Signed)
ED Antimicrobial Stewardship Positive Culture Follow Up   [name] is an 35 y.o. male who presented to [facility] on 08/14/2021 with a chief complaint of hematuria and flank pain radiating to the chest. PMH includes spinal cord injury and bilateral AKA.   Spoke with patient over the phone. Patient reported pain is consistent and worsening. Also complains of tiredness and concerned he may have fevers.   Chief Complaint  Patient presents with   Hematuria   Chest Pain    Recent Results (from the past 720 hour(s))  Urine Culture     Status: Abnormal   Collection Time: 08/14/21  6:54 AM   Specimen: In/Out Cath Urine  Result Value Ref Range Status   Specimen Description   Final    IN/OUT CATH URINE Performed at [facility], [address] [place], [place] [address]    Special Requests   Final    NONE Performed at [facility], [address] [place], [place] [address]    Culture 40,000 COLONIES/mL STAPHYLOCOCCUS EPIDERMIDIS (A)  Final   Report Status 08/16/2021 FINAL  Final   Organism ID, Bacteria STAPHYLOCOCCUS EPIDERMIDIS (A)  Final      Susceptibility   Staphylococcus epidermidis - MIC*    CIPROFLOXACIN <=0.5 SENSITIVE Sensitive     GENTAMICIN <=0.5 SENSITIVE Sensitive     NITROFURANTOIN <=16 SENSITIVE Sensitive     OXACILLIN >=4 RESISTANT Resistant     TETRACYCLINE <=1 SENSITIVE Sensitive     VANCOMYCIN 1 SENSITIVE Sensitive     TRIMETH/SULFA 160 RESISTANT Resistant     CLINDAMYCIN <=0.25 SENSITIVE Sensitive     RIFAMPIN <=0.5 SENSITIVE Sensitive     Inducible Clindamycin NEGATIVE Sensitive     * 40,000 COLONIES/mL STAPHYLOCOCCUS EPIDERMIDIS  Blood culture (routine single)     Status: None (Preliminary result)   Collection Time: 08/14/21 10:32 AM   Specimen: BLOOD  Result Value Ref Range Status   Specimen Description BLOOD LEFT ANTECUBITAL  Final   Special Requests   Final    BOTTLES DRAWN AEROBIC AND ANAEROBIC Blood Culture adequate volume    Culture   Final    NO GROWTH 3 DAYS Performed at [facility], [address], [place], [place] [address]    Report Status PENDING  Incomplete    [x]  Treated with Bactrim, organism resistant to prescribed antimicrobial []  Patient discharged originally without antimicrobial agent and treatment is now indicated  Due to initial presentation of flank pain, multiple resistant PO antibiotics and now systemic symptoms of infection, plan is to have patient return to ED.  Called patient. Instructed him to return to the ED as soon as possible. Patient reported that he works in [place], but will return to the [facility] ED.  ED Provider: Dr. [place], PharmD Pharmacy Resident  08/17/2021 11:04 AM

## 2021-08-17 NOTE — Discharge Instructions (Signed)
Please seek medical attention for any high fevers, chest pain, shortness of breath, change in behavior, persistent vomiting, bloody stool or any other new or concerning symptoms.  

## 2021-08-17 NOTE — ED Provider Notes (Signed)
[facility] Emergency Department Provider Note  ____________________________________________   I have reviewed the triage vital signs and the nursing notes.   HISTORY  Chief Complaint Cystitis   History limited by: Not Limited   HPI [name] is a 35 y.o. male who presents to the emergency department today because of concerns for UTI with bacteria resistant to antibiotic he was prescribed.  Patient was seen in the emergency department 3 days ago.  He was diagnosed with UTI at that time.  Patient does self cath.  Patient was given prescription for Bactrim however antibiotic susceptibility came back resistant to Bactrim.  Patient states his symptoms have persisted.  He is having some chills at night.  Continues to have some abdominal and flank pain.  Patient has had slightly decreased oral intake.   Records reviewed. Per medical record review patient has a history of ER visit 3 days ago, diagnosed with UTI. CT was performed at that time.   Past Medical History:  Diagnosis Date   Autonomic dysreflexia    Bladder stone    Kidney stone    Pyelonephritis    S/P AKA (above knee amputation) bilateral (HCC)    Spinal cord injury at T1-T6 level [facility])    UTI (urinary tract infection)     Patient Active Problem List   Diagnosis Date Noted   Complicated UTI (urinary tract infection) 06/18/2021   Diarrhea 06/18/2021   Tobacco abuse 06/18/2021   Chronic osteomyelitis with draining sinus (HCC) 12/15/2020   Chronic pain 12/15/2020   ED (erectile dysfunction) 12/15/2020   Neurogenic bowel 12/15/2020   Paraplegia (HCC) 12/15/2020   Constipation 01/21/2018   History of MRSA infection 01/15/2018   Obesity (BMI 30-39.9) 12/30/2017   Heterotopic ossification of bone 10/31/2017   Acute cystitis with hematuria 10/10/2017   Chronic nausea 10/10/2017   Frequent urination 10/10/2017   Depression 09/20/2016   Suicidal ideation 09/20/2016   Acute UTI 09/04/2016    Headache, tension-type 09/04/2016   Autonomic dysreflexia 03/10/2016   Pyelonephritis 03/10/2016   Pain, chronic due to trauma 03/05/2014   S/P AKA (above knee amputation) unilateral (HCC) 10/30/2013   Back pain 05/09/2013   Nephrolithiasis 03/19/2013   History of syncope 03/18/2013   Chronic anemia 02/05/2013   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 Authorizing Provider  acetaminophen (TYLENOL) 500 MG tablet Take 2 tablets (1,000 mg total) by mouth every 8 (eight) hours as needed for mild pain, moderate pain, fever or headache. 03/12/21 03/12/22  Don Perking, [place], MD  oxyCODONE-acetaminophen (PERCOCET) 5-325 MG tablet Take 1 tablet by mouth every 4 (four) hours as needed for severe pain. 08/14/21 08/14/22  Fisher, Roselyn Bering, PA-C  phenazopyridine (PYRIDIUM) 200 MG tablet Take 1 tablet (200 mg total) by mouth 3 (three) times daily with meals. 06/19/21   Tresa Moore, MD  sulfamethoxazole-trimethoprim (BACTRIM DS) 800-160 MG tablet Take 1 tablet by mouth 2 (two) times daily. 08/14/21   Fisher, Roselyn Bering, PA-C  tamsulosin (FLOMAX) 0.4 MG CAPS capsule Take 1 capsule (0.4 mg total) by mouth daily. 08/14/21   Fisher, Roselyn Bering, PA-C    Allergies Ciprofloxacin, Coconut flavor, Ketorolac, Oxycodone, Coconut oil, Tobramycin, Cefdinir, Latex, Other, Piperacillin-tazobactam in dex, and Tape  Family History  Problem Relation Age of Onset  Colon cancer Mother     Social History Social History   Tobacco Use   Smoking status: Every Day    Types: Cigarettes   Smokeless tobacco: Never  Substance Use Topics   Alcohol use: Yes   Drug use: Yes    Types: Marijuana    Review of Systems Constitutional: Positive for chills.  Eyes: No visual changes. ENT: No sore throat. Cardiovascular: Denies  chest pain. Respiratory: Denies shortness of breath. Gastrointestinal: Positive for abdominal pain. Genitourinary: Positive for increased urinatino. Musculoskeletal: Negative for back pain. Skin: Negative for rash. Neurological: Negative for headaches, focal weakness or numbness.  ____________________________________________   PHYSICAL EXAM:  VITAL SIGNS: ED Triage Vitals  Enc Vitals Group     BP 08/17/21 1748 120/86     Pulse Rate 08/17/21 1748 79     Resp 08/17/21 1748 20     Temp 08/17/21 1748 98.4 F (36.9 C)     Temp Source 08/17/21 1748 Oral     SpO2 08/17/21 1748 100 %     Weight 08/17/21 1752 182 lb (82.6 kg)     Height 08/17/21 1752 5\' 4"  (1.626 m)     Head Circumference --      Peak Flow --      Pain Score 08/17/21 1733 0   Constitutional: Alert and oriented.  Eyes: Conjunctivae are normal.  ENT      Head: Normocephalic and atraumatic.      Nose: No congestion/rhinnorhea.      Mouth/Throat: Mucous membranes are moist.      Neck: No stridor. Hematological/Lymphatic/Immunilogical: No cervical lymphadenopathy. Cardiovascular: Normal rate, regular rhythm.  No murmurs, rubs, or gallops.  Respiratory: Normal respiratory effort without tachypnea nor retractions. Breath sounds are clear and equal bilaterally. No wheezes/rales/rhonchi. Gastrointestinal: Soft and non tender. No rebound. No guarding.  Genitourinary: Deferred Musculoskeletal: s/p bilateral aka Neurologic:  Normal speech and language. No gross focal neurologic deficits are appreciated.  Skin:  Skin is warm, dry and intact. No rash noted. Psychiatric: Mood and affect are normal. Speech and behavior are normal. Patient exhibits appropriate insight and judgment.  ____________________________________________    LABS (pertinent positives/negatives)  Lactic acid 2.0 to 1.3 BMP wnl except glu 131 CBC wbc 5.2, hgb 13.5, plt  253  ____________________________________________   EKG  None  ____________________________________________    RADIOLOGY  None  ____________________________________________   PROCEDURES  Procedures  ____________________________________________   INITIAL IMPRESSION / ASSESSMENT AND PLAN / ED COURSE  Pertinent labs & imaging results that were available during my care of the patient were reviewed by me and considered in my medical decision making (see chart for details).   Patient presented to the emergency department today because of concerns for antibiotic resistance.  Patient was recently seen and treated for UTI however bacteria was resistant to antibiotic that patient was prescribed.  Patient does have multiple antibiotic allergies.  Patient was given dose of clindamycin here in the emergency department.  Additionally had minimally elevated lactate of 2.0 however this did improve after fluids.  No fever or leukocytosis. Will discharge with further antibiotics.  ____________________________________________   FINAL CLINICAL IMPRESSION(S) / ED DIAGNOSES  Final diagnoses:  Urinary tract infection with hematuria, site unspecified     Note: This dictation was prepared with Dragon dictation. Any transcriptional errors that result from this process are unintentional     08/19/21, MD 08/17/21 2243

## 2021-08-17 NOTE — ED Triage Notes (Signed)
Pt stating he was seen recently and given bactrim antiobiotc but hasn't felt any better. Pt also states decreased urination since last visit. Pt self caths at home. Pt endorses pain in groin and back. VSS.

## 2021-08-17 NOTE — ED Provider Notes (Signed)
Emergency Medicine Provider Triage Evaluation Note  AIYDEN LAUDERBACK, a 35 y.o. male  was evaluated in triage.  Patient is a double amputee who self caths.  Pt complains of generalized malaise and ongoing back pain due to cystitis.  Patient seen 2 days ago started empirically on Bactrim for his cystitis.  Urine culture results confirm resistance to Bactrim and patient was called by the pharmacy to return to the ED due to worsening symptoms.  He denies any urinary retention but does endorse some cold sweats.  Review of Systems  Positive: Generalized malaise, dysuria Negative: NV  Physical Exam  BP 120/86 (BP Location: Left Arm)   Pulse 79   Temp 98.4 F (36.9 C) (Oral)   Resp 20   SpO2 100%  Gen:   Awake, no distress  NAD Resp:  Normal effort CTA MSK:   Moves extremities without difficulty  Other:  ABD: mild flank tenderness  Medical Decision Making  Medically screening exam initiated at 5:50 PM.  Appropriate orders placed.  ADGER CANTERA was informed that the remainder of the evaluation will be completed by another provider, this initial triage assessment does not replace that evaluation, and the importance of remaining in the ED until their evaluation is complete.  Patient returning to the ED for evaluation after urine culture results confirm resistance to previously prescribed Bactrim.   Lissa Hoard, PA-C 08/17/21 1804    Concha Se, MD 08/17/21 [phone]

## 2021-08-19 LAB — CULTURE, BLOOD (SINGLE)
Culture: NO GROWTH
Special Requests: ADEQUATE

## 2021-09-22 ENCOUNTER — Other Ambulatory Visit: Payer: Self-pay

## 2021-09-22 ENCOUNTER — Emergency Department
Admission: EM | Admit: 2021-09-22 | Discharge: 2021-09-22 | Disposition: A | Discharge status: Home / Self Care | Payer: Medicare Other | Attending: Emergency Medicine | Admitting: Emergency Medicine

## 2021-09-22 ENCOUNTER — Emergency Department: Payer: Medicare Other

## 2021-09-22 ENCOUNTER — Encounter: Payer: Self-pay | Admitting: Emergency Medicine

## 2021-09-22 DIAGNOSIS — N3021 Other chronic cystitis with hematuria: Secondary | ICD-10-CM | POA: Insufficient documentation

## 2021-09-22 DIAGNOSIS — R079 Chest pain, unspecified: Secondary | ICD-10-CM | POA: Diagnosis not present

## 2021-09-22 DIAGNOSIS — Z91040 Latex allergy status: Secondary | ICD-10-CM | POA: Diagnosis not present

## 2021-09-22 DIAGNOSIS — K5900 Constipation, unspecified: Secondary | ICD-10-CM

## 2021-09-22 DIAGNOSIS — Z20822 Contact with and (suspected) exposure to covid-19: Secondary | ICD-10-CM | POA: Insufficient documentation

## 2021-09-22 DIAGNOSIS — F17210 Nicotine dependence, cigarettes, uncomplicated: Secondary | ICD-10-CM | POA: Insufficient documentation

## 2021-09-22 DIAGNOSIS — Z79899 Other long term (current) drug therapy: Secondary | ICD-10-CM | POA: Diagnosis not present

## 2021-09-22 DIAGNOSIS — R310 Gross hematuria: Secondary | ICD-10-CM

## 2021-09-22 DIAGNOSIS — N50819 Testicular pain, unspecified: Secondary | ICD-10-CM

## 2021-09-22 DIAGNOSIS — N3020 Other chronic cystitis without hematuria: Secondary | ICD-10-CM

## 2021-09-22 LAB — COMPREHENSIVE METABOLIC PANEL
ALT: 44 U/L (ref 0–44)
AST: 33 U/L (ref 15–41)
Albumin: 3.7 g/dL (ref 3.5–5.0)
Alkaline Phosphatase: 126 U/L (ref 38–126)
Anion gap: 5 (ref 5–15)
BUN: 20 mg/dL (ref 6–20)
CO2: 26 mmol/L (ref 22–32)
Calcium: 9.1 mg/dL (ref 8.9–10.3)
Chloride: 108 mmol/L (ref 98–111)
Creatinine, Ser: 0.87 mg/dL (ref 0.61–1.24)
GFR, Estimated: >60 mL/min (ref >60)
Glucose, Bld: 104 mg/dL — ABNORMAL HIGH (ref 70–99)
Potassium: 4 mmol/L (ref 3.5–5.1)
Sodium: 139 mmol/L (ref 135–145)
Total Bilirubin: 0.5 mg/dL (ref 0.3–1.2)
Total Protein: 7.8 g/dL (ref 6.5–8.1)

## 2021-09-22 LAB — CHLAMYDIA/NGC RT PCR (ARMC ONLY)
Chlamydia Tr: NOT DETECTED
N gonorrhoeae: NOT DETECTED

## 2021-09-22 LAB — CBC WITH DIFFERENTIAL/PLATELET
Abs Immature Granulocytes: 0.02 10*3/uL (ref 0.00–0.07)
Basophils Absolute: 0 10*3/uL (ref 0.0–0.1)
Basophils Relative: 0 %
Eosinophils Absolute: 0.2 10*3/uL (ref 0.0–0.5)
Eosinophils Relative: 4 %
HCT: 42.9 % (ref 39.0–52.0)
Hemoglobin: 13.6 g/dL (ref 13.0–17.0)
Immature Granulocytes: 0 %
Lymphocytes Relative: 30 %
Lymphs Abs: 1.5 10*3/uL (ref 0.7–4.0)
MCH: 30.7 pg (ref 26.0–34.0)
MCHC: 31.7 g/dL (ref 30.0–36.0)
MCV: 96.8 fL (ref 80.0–100.0)
Monocytes Absolute: 0.5 10*3/uL (ref 0.1–1.0)
Monocytes Relative: 9 %
Neutro Abs: 2.6 10*3/uL (ref 1.7–7.7)
Neutrophils Relative %: 57 %
Platelets: 262 10*3/uL (ref 150–400)
RBC: 4.43 MIL/uL (ref 4.22–5.81)
RDW: 13.2 % (ref 11.5–15.5)
WBC: 4.8 10*3/uL (ref 4.0–10.5)
nRBC: 0 % (ref 0.0–0.2)

## 2021-09-22 LAB — URINALYSIS, COMPLETE (UACMP) WITH MICROSCOPIC
Bacteria, UA: NONE SEEN
Bilirubin Urine: NEGATIVE
Glucose, UA: NEGATIVE mg/dL
Ketones, ur: NEGATIVE mg/dL
Nitrite: NEGATIVE
Protein, ur: 100 mg/dL — AB
RBC / HPF: >50 RBC/hpf — ABNORMAL HIGH (ref 0–5)
Specific Gravity, Urine: 1.024 (ref 1.005–1.030)
Squamous Epithelial / HPF: NONE SEEN (ref 0–5)
WBC, UA: >50 WBC/hpf — ABNORMAL HIGH (ref 0–5)
pH: 6 (ref 5.0–8.0)

## 2021-09-22 LAB — LACTIC ACID, PLASMA
Lactic Acid, Venous: 2 mmol/L (ref 0.5–1.9)
Lactic Acid, Venous: 1.2 mmol/L (ref 0.5–1.9)

## 2021-09-22 LAB — RESP PANEL BY RT-PCR (FLU A&B, COVID) ARPGX2
Influenza A by PCR: NEGATIVE
Influenza B by PCR: NEGATIVE
SARS Coronavirus 2 by RT PCR: NEGATIVE

## 2021-09-22 LAB — TROPONIN I (HIGH SENSITIVITY): Troponin I (High Sensitivity): 5 ng/L (ref <18)

## 2021-09-22 MED ORDER — ONDANSETRON HCL 4 MG/2ML IJ SOLN
4.0000 mg | Freq: Once | INTRAMUSCULAR | Status: AC
  Administered 2021-09-22: 4 mg via INTRAVENOUS
  Filled 2021-09-22: qty 2

## 2021-09-22 MED ORDER — SODIUM CHLORIDE 0.9 % IV SOLN
1.0000 g | Freq: Once | INTRAVENOUS | Status: AC
  Administered 2021-09-22: 1 g via INTRAVENOUS
  Filled 2021-09-22: qty 10

## 2021-09-22 MED ORDER — CEFDINIR 300 MG PO CAPS: 300.0000 mg | ORAL_CAPSULE | Freq: Two times a day (BID) | ORAL | 0 refills | Status: DC

## 2021-09-22 MED ORDER — HYDROMORPHONE HCL 1 MG/ML IJ SOLN
1.0000 mg | Freq: Once | INTRAMUSCULAR | Status: AC
  Administered 2021-09-22: 1 mg via INTRAVENOUS
  Filled 2021-09-22: qty 1

## 2021-09-22 MED ORDER — CEFDINIR 300 MG PO CAPS: 300.0000 mg | ORAL_CAPSULE | Freq: Two times a day (BID) | ORAL | 0 refills | Status: AC

## 2021-09-22 MED ORDER — LACTATED RINGERS IV BOLUS
1000.0000 mL | Freq: Once | INTRAVENOUS | Status: AC
  Administered 2021-09-22: 1000 mL via INTRAVENOUS

## 2021-09-22 NOTE — ED Notes (Signed)
RN to bedside to place foley catheter, pt reports he places them himself at home and will wait until he is home for foley cath. Dr Katrinka Blazing aware.
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
## 2021-09-22 NOTE — ED Provider Notes (Signed)
Emergency Medicine Provider Triage Evaluation Note  [name] , a 35 y.o. male  was evaluated in triage.  Pt complains of hematuria and other symptoms as listed below which concern him for a recurrent UTI (recently treated for cystitis).  Patient has a known large bladder calculus.  Patient self-caths for urine and is s/p bilateral AKAs and has a thoracic spinal cord injury.  Review of Systems  Positive: Subjective fever/chills, general malaise and fatigue, nausea, gross hematuria, dull pain in chest, back, abdomen. Negative: Dyspnea.  Physical Exam  BP (!) 139/91 (BP Location: Left Arm)   Pulse 68   Temp 97.6 F (36.4 C) (Oral)   Resp 18   Wt 85.7 kg   SpO2 100%   BMI 32.44 kg/m  Gen:   Awake, no distress   Resp:  Normal effort  MSK:   Moves arms w/o difficult.  s/p bilateral AKA of lower extremities. Other:  stable vitals  Medical Decision Making  Medically screening exam initiated at 6:12 AM.  Appropriate orders placed.  [name] was informed that the remainder of the evaluation will be completed by another provider, this initial triage assessment does not replace that evaluation, and the importance of remaining in the ED until their evaluation is complete.  "Possible sepsis" labs pending.  Patient informed of our need for a urine specimen.  Patient has hx of cystitis w/ known bladder calculus.   Russell Rose, MD 09/22/21 [phone]

## 2021-09-22 NOTE — ED Triage Notes (Signed)
Pt to ED from home c/o upper back pain and hematuria, fever of 101.7 at home today and took tylenol around 0315.  States lot of clots in urine.  Was treated a couple weeks ago for same and finished ABX.  Pt also c/o left chest pain that has been for a couple weeks, dull pain.  Pt A&Ox4, chest rise even and unlabored, skin WNL and in NAD at this time.

## 2021-09-22 NOTE — ED Notes (Signed)
One set of blood cultures drawn. EDP aware

## 2021-09-23 LAB — URINE CULTURE

## 2021-09-27 LAB — CULTURE, BLOOD (ROUTINE X 2)
Culture: NO GROWTH
Special Requests: ADEQUATE

## 2022-05-03 IMAGING — CT CT HEAD W/O CM
3 series · 15 of 47 positions shown, 18 images · non-contrast
Comparison: CT head 12/05/2010 report without imaging

CLINICAL DATA: he fell while transferring from his truck to his
wheelchair. Then while going into his house the wheelchair caught on
lip of threshold and the chair tipped back and his fell again

EXAM:
CT HEAD WITHOUT CONTRAST
CT CERVICAL SPINE WITHOUT CONTRAST
TECHNIQUE: Multidetector CT imaging of the head and cervical spine was
performed following the standard protocol without intravenous
contrast. Multiplanar CT image reconstructions of the cervical spine
were also generated.

[Series 2: head wo · axial · 0.48mm/px · z∈[-109,+41]mm · 9 of 36 slices shown, 12 images]
[im 3/36  brain]
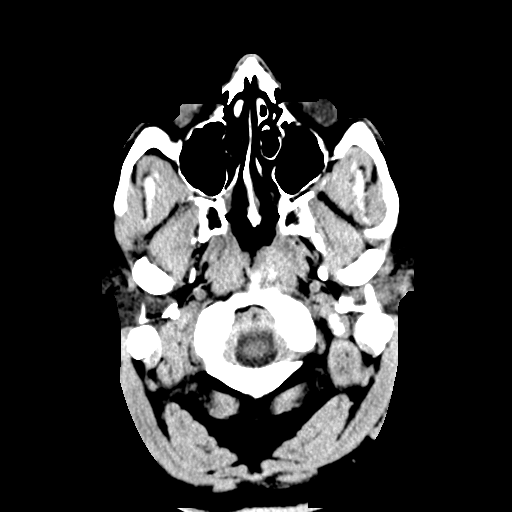
[im 3/36  bone]
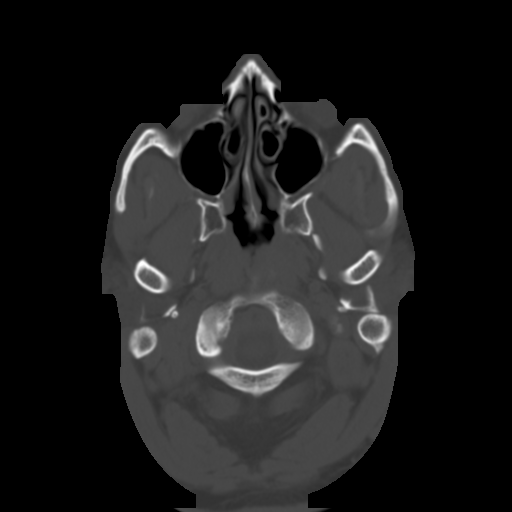
[im 7/36  brain]
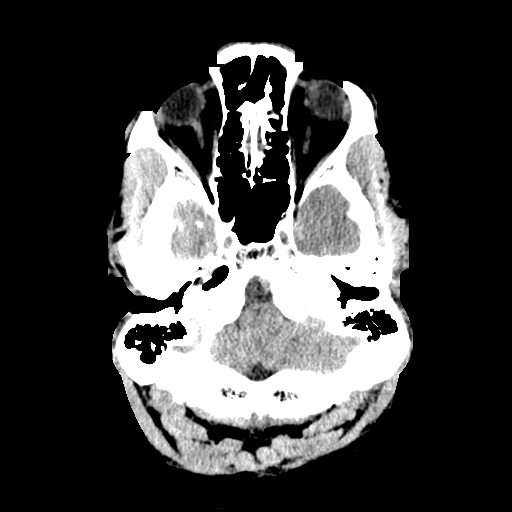
[im 10/36  brain]
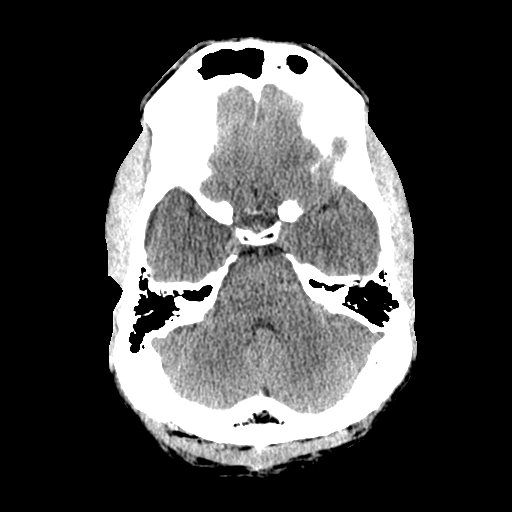
[im 14/36  brain]
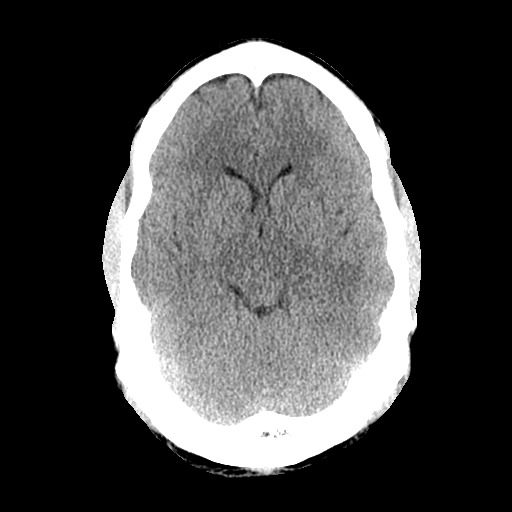
[im 19/36  brain]
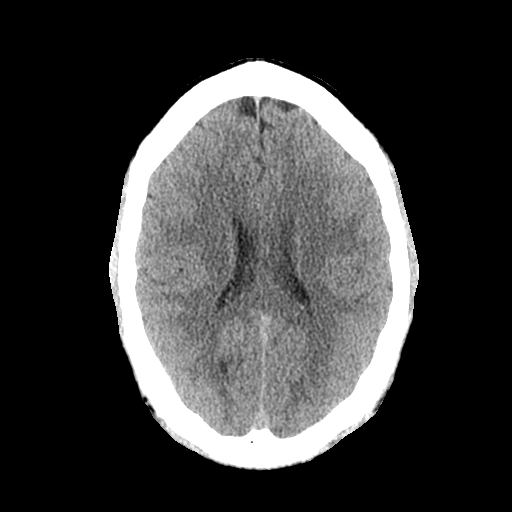
[im 19/36  bone]
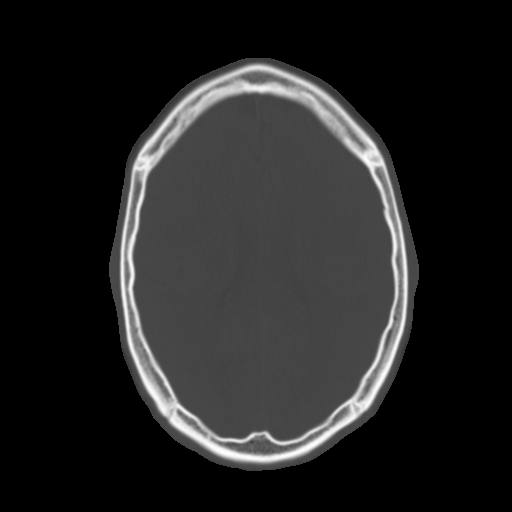
[im 22/36  brain]
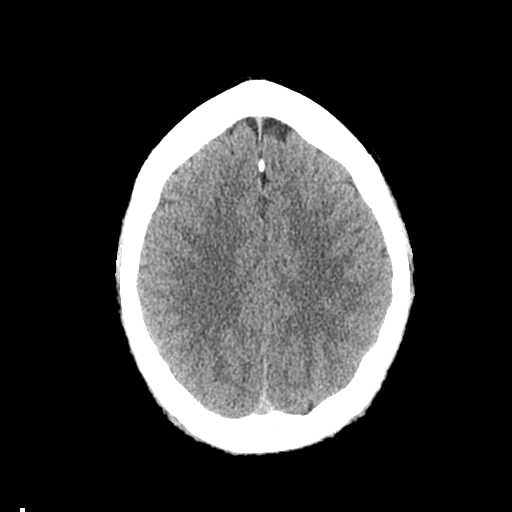
[im 26/36  brain]
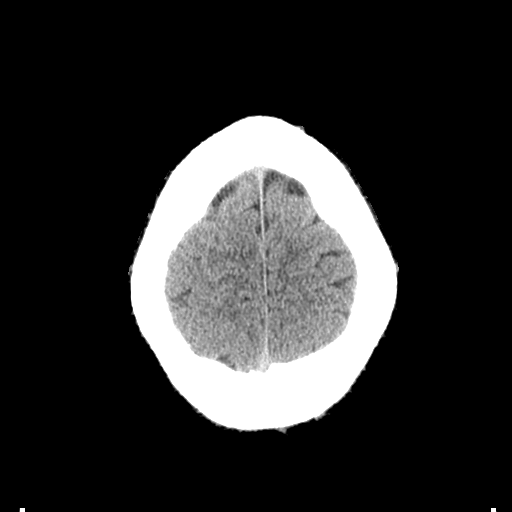
[im 29/36  brain]
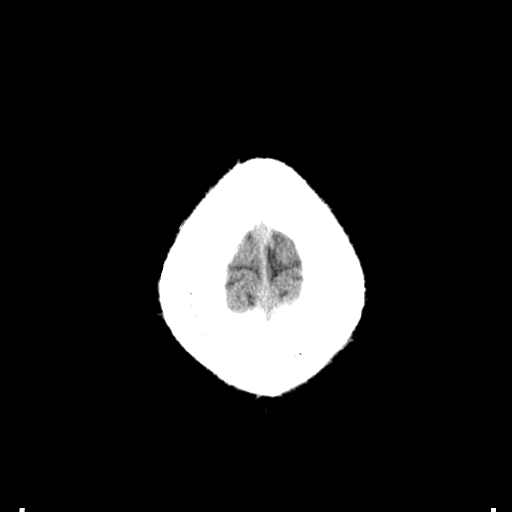
[im 33/36  brain]
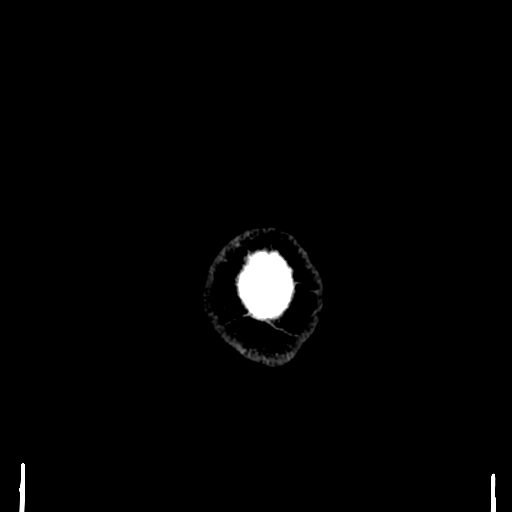
[im 33/36  bone]
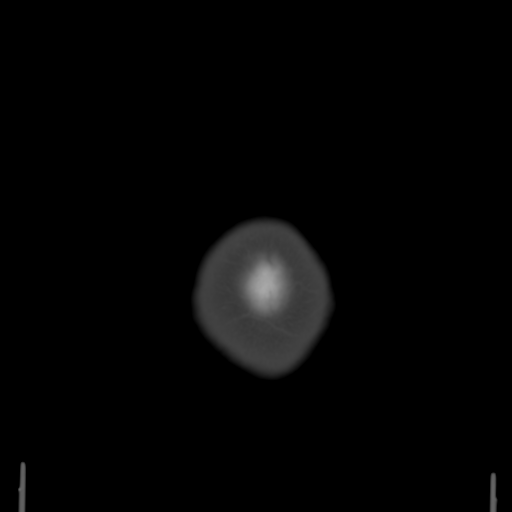

[Series 4: coronal soft tissue · coronal · 0.41mm/px · 3 of 82 slices shown]
[im 28/82  brain]
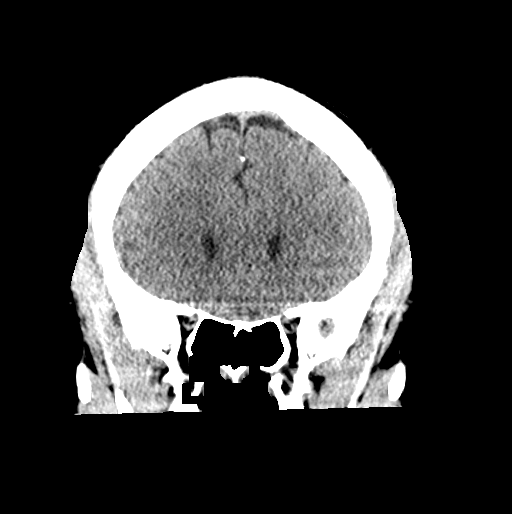
[im 37/82  brain]
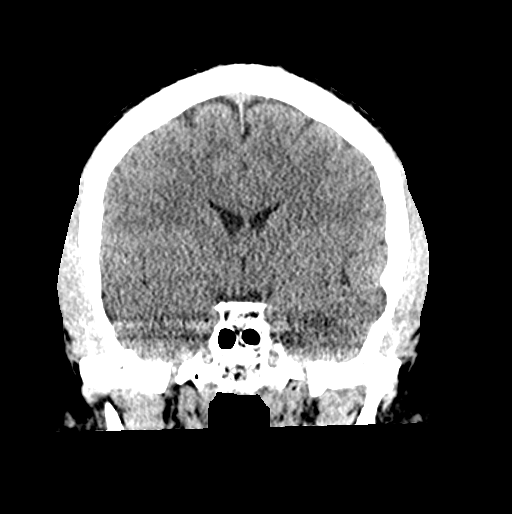
[im 46/82  brain]
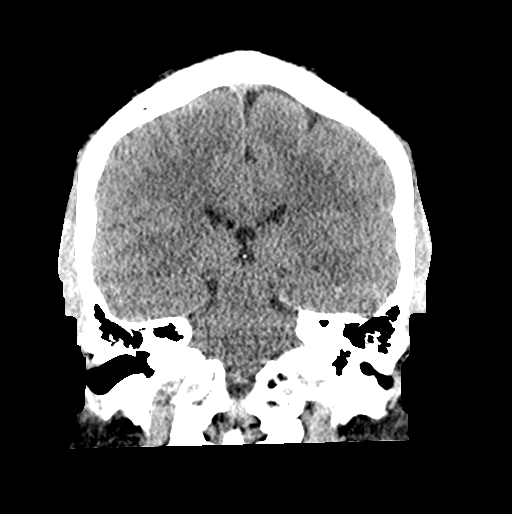

[Series 5: sagittal soft tissue · sagittal · 0.37mm/px · 3 of 63 slices shown]
[im 21/63  brain]
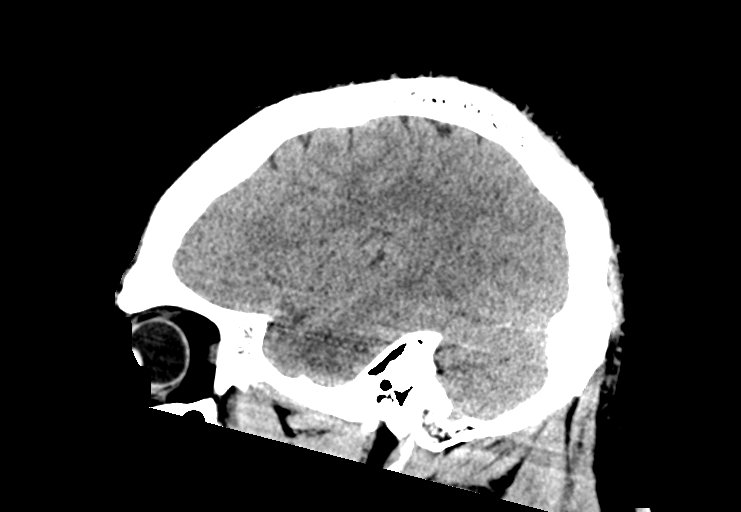
[im 32/63  brain]
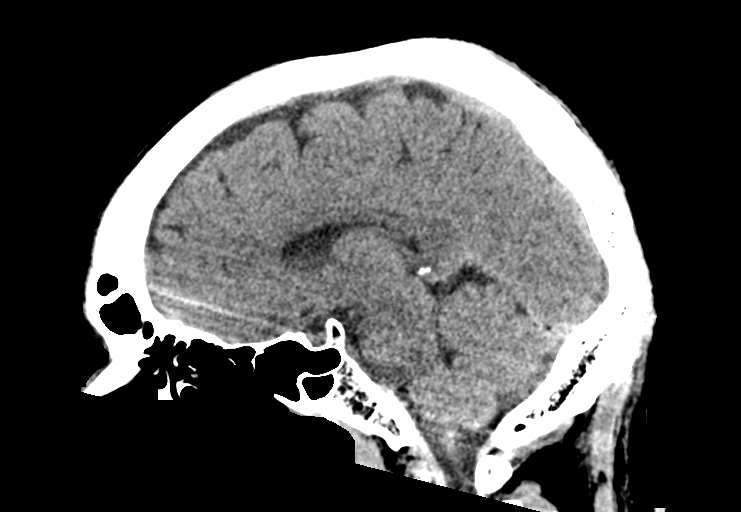
[im 42/63  brain]
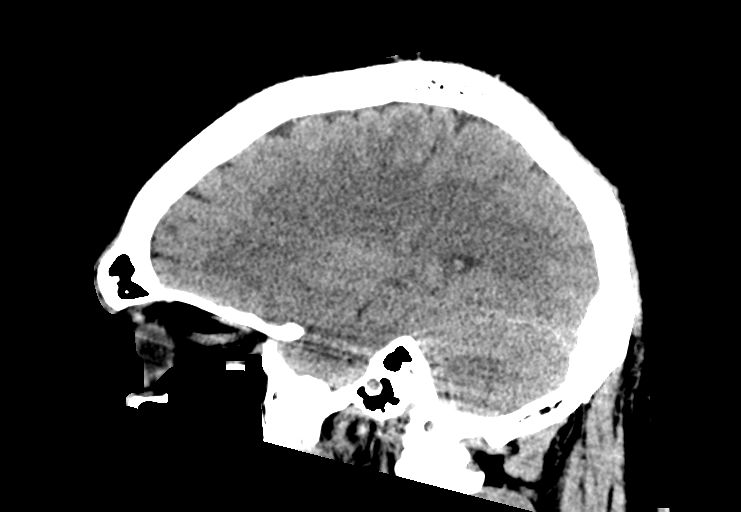

[15 of 47 positions shown; findings below may reference images not displayed]

FINDINGS: CT HEAD FINDINGS

Brain:

No evidence of large-territorial acute infarction. No parenchymal
hemorrhage. No mass lesion. No extra-axial collection.

No mass effect or midline shift. No hydrocephalus. Basilar cisterns
are patent.

Vascular: No hyperdense vessel.

Skull: No acute fracture or focal lesion.

Sinuses/Orbits: Paranasal sinuses and mastoid air cells are clear.
The orbits are unremarkable.

Other: None.

CT CERVICAL SPINE FINDINGS

Alignment: Normal.

Skull base and vertebrae: No acute fracture. No aggressive appearing
focal osseous lesion or focal pathologic process.

Soft tissues and spinal canal: No prevertebral fluid or swelling. No
visible canal hematoma.

Upper chest: Unremarkable.

Other: None.
IMPRESSION: 1. No acute intracranial abnormality.
2. No acute displaced fracture or traumatic listhesis of the
cervical spine.

## 2023-10-01 ENCOUNTER — Emergency Department
Admission: EM | Admit: 2023-10-01 | Discharge: 2023-10-01 | Disposition: A | Discharge status: Home / Self Care | Payer: 59 | Attending: Emergency Medicine | Admitting: Emergency Medicine

## 2023-10-01 ENCOUNTER — Emergency Department: Payer: 59

## 2023-10-01 ENCOUNTER — Other Ambulatory Visit: Payer: Self-pay

## 2023-10-01 ENCOUNTER — Encounter: Payer: Self-pay | Admitting: Emergency Medicine

## 2023-10-01 DIAGNOSIS — S161XXA Strain of muscle, fascia and tendon at neck level, initial encounter: Secondary | ICD-10-CM | POA: Insufficient documentation

## 2023-10-01 DIAGNOSIS — Y92410 Unspecified street and highway as the place of occurrence of the external cause: Secondary | ICD-10-CM | POA: Insufficient documentation

## 2023-10-01 DIAGNOSIS — S199XXA Unspecified injury of neck, initial encounter: Secondary | ICD-10-CM | POA: Diagnosis present

## 2023-10-01 DIAGNOSIS — M25512 Pain in left shoulder: Secondary | ICD-10-CM | POA: Insufficient documentation

## 2023-10-01 MED ORDER — BACLOFEN 10 MG PO TABS: 10.0000 mg | ORAL_TABLET | Freq: Three times a day (TID) | ORAL | 0 refills | Status: AC

## 2023-10-01 MED ORDER — MELOXICAM 15 MG PO TABS: 15.0000 mg | ORAL_TABLET | Freq: Every day | ORAL | 0 refills | Status: AC
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
